# Patient Record
Sex: Female | Born: 1985 | Race: Black or African American | Hispanic: No | Smoking: Never smoker
Health system: Southern US, Community
[De-identification: ages and names within clinical notes are randomized; demographics above are authoritative.]

## PROBLEM LIST (undated history)

## (undated) DIAGNOSIS — D649 Anemia, unspecified: Secondary | ICD-10-CM

## (undated) HISTORY — PX: DILATION AND CURETTAGE OF UTERUS: SHX78

## (undated) HISTORY — DX: Anemia, unspecified: D64.9

---

## 2008-05-28 ENCOUNTER — Ambulatory Visit: Payer: Self-pay | Admitting: Obstetrics and Gynecology

## 2009-01-13 ENCOUNTER — Ambulatory Visit: Payer: Self-pay | Admitting: Internal Medicine

## 2009-07-13 ENCOUNTER — Ambulatory Visit: Payer: Self-pay

## 2009-07-31 ENCOUNTER — Ambulatory Visit: Payer: Self-pay

## 2010-02-03 ENCOUNTER — Inpatient Hospital Stay: Payer: Self-pay

## 2010-12-22 IMAGING — CT CT STONE STUDY
1 of 2 series · 15 of 32 positions shown, 19 images · non-contrast
Comparison: none

REASON FOR EXAM: UPPER LEFT STONE   SEEN ON X RAY
COMMENTS:

PROCEDURE:     CT  - CT ABDOMEN /PELVIS WO (STONE)  - January 13, 2009  [DATE]
RESULT:
TECHNIQUE: Helical 3 mm sections were obtained from the lung bases through
the pubic symphysis without the administration of intravenous contrast.

[Series 2: stone · axial · 0.82mm/px · z∈[-252,+186]mm · 15 of 160 slices shown, 19 images]
[im 7/160  soft-tissue]
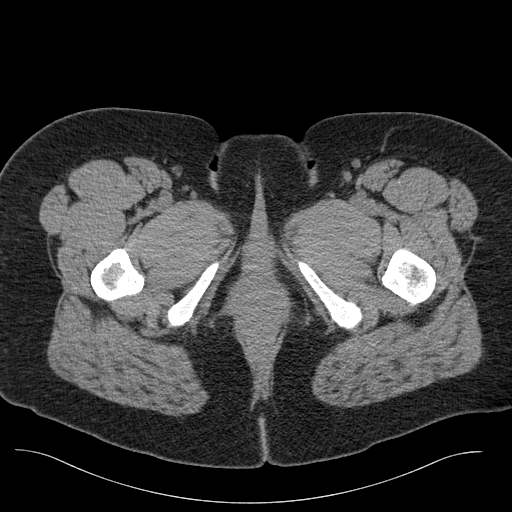
[im 7/160  bone]
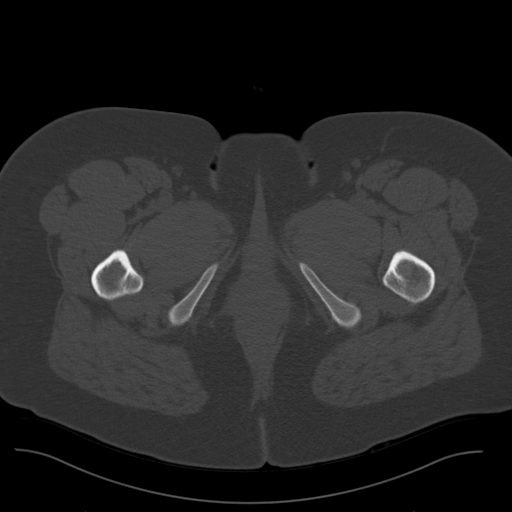
[im 19/160  soft-tissue]
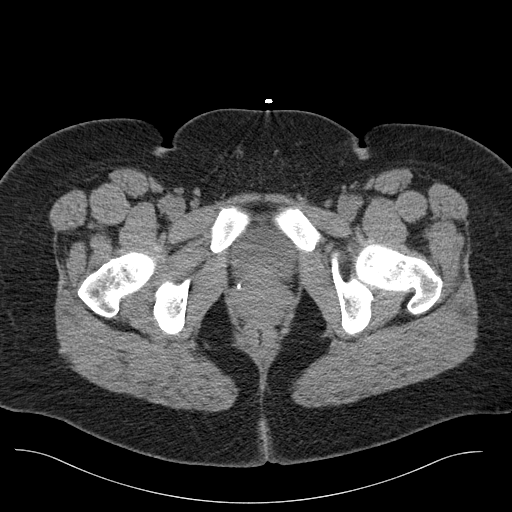
[im 31/160  soft-tissue]
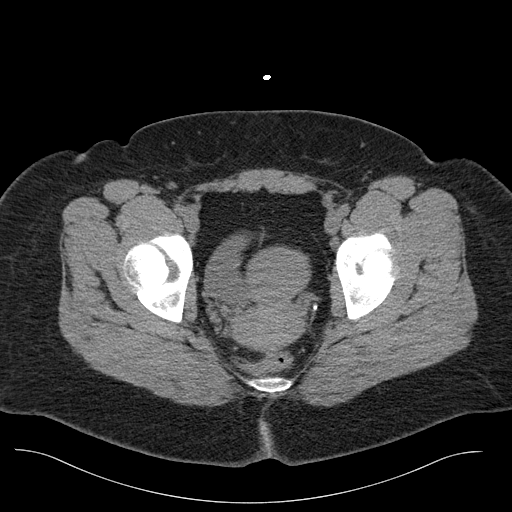
[im 43/160  soft-tissue]
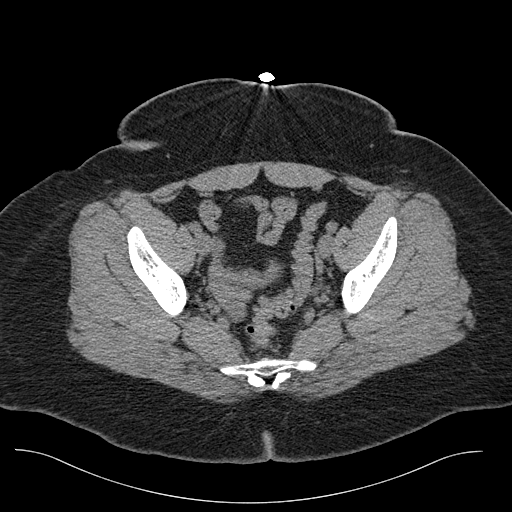
[im 56/160  soft-tissue]
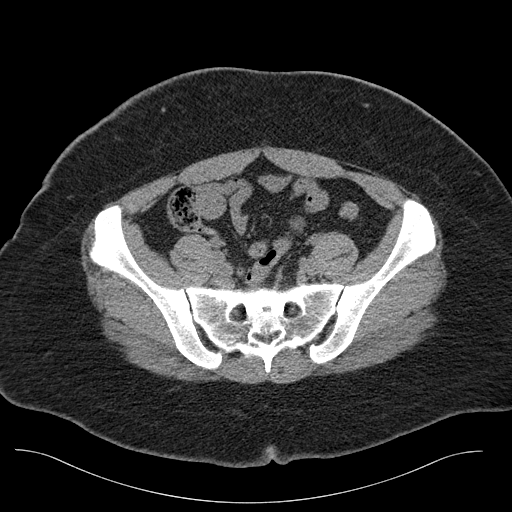
[im 68/160  soft-tissue]
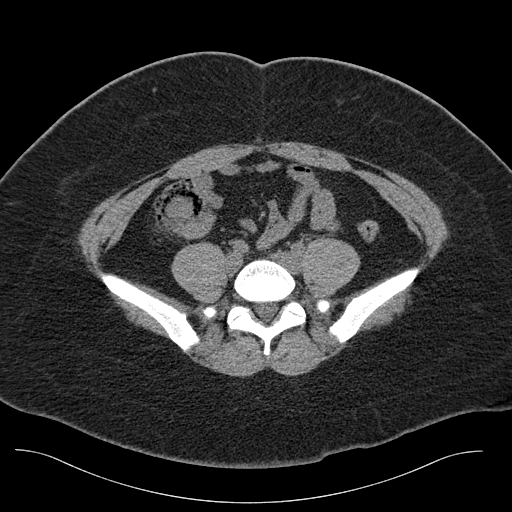
[im 80/160  soft-tissue]
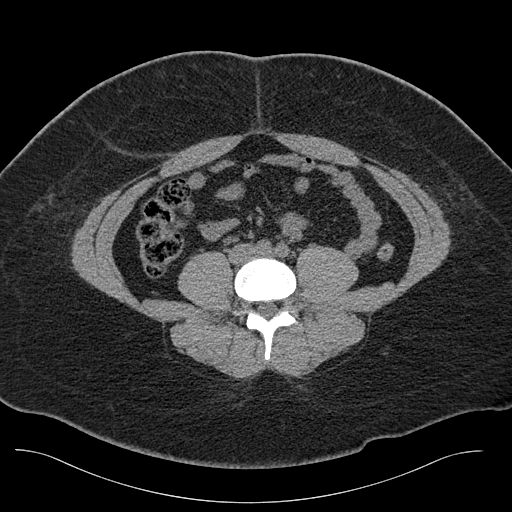
[im 92/160  soft-tissue]
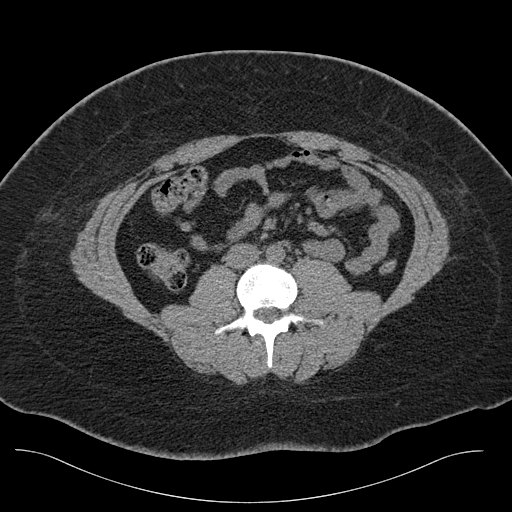
[im 104/160  soft-tissue]
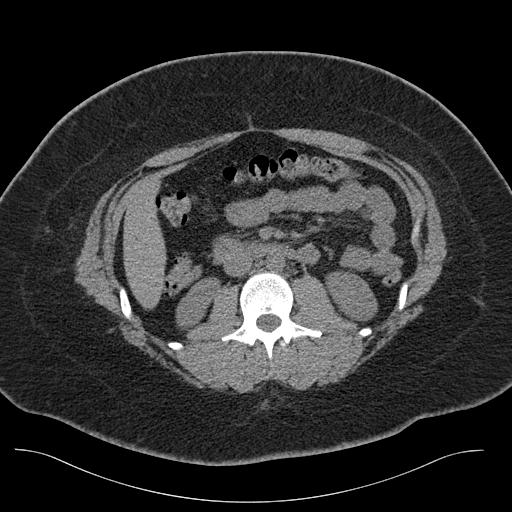
[im 104/160  bone]
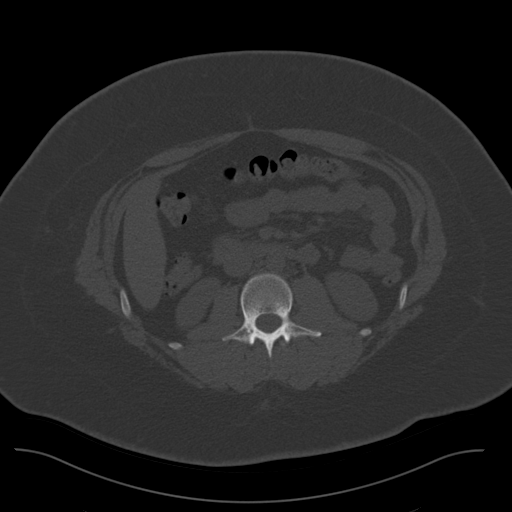
[im 117/160  soft-tissue]
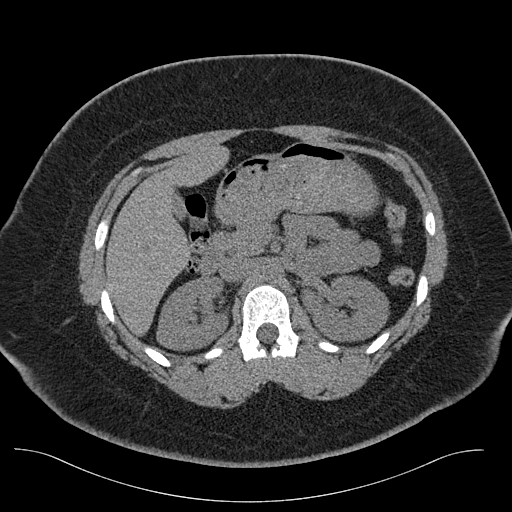
[im 129/160  soft-tissue]
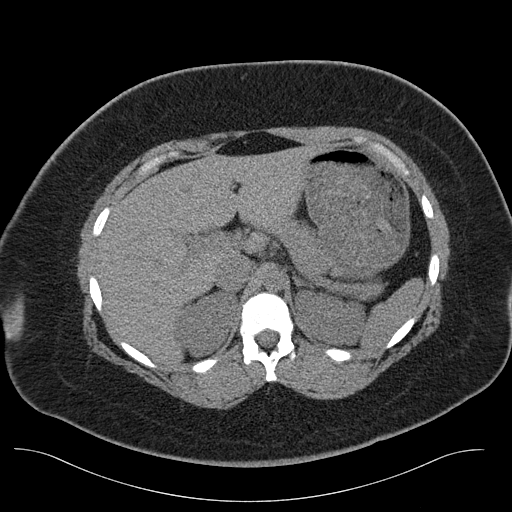
[im 135/160  lung]
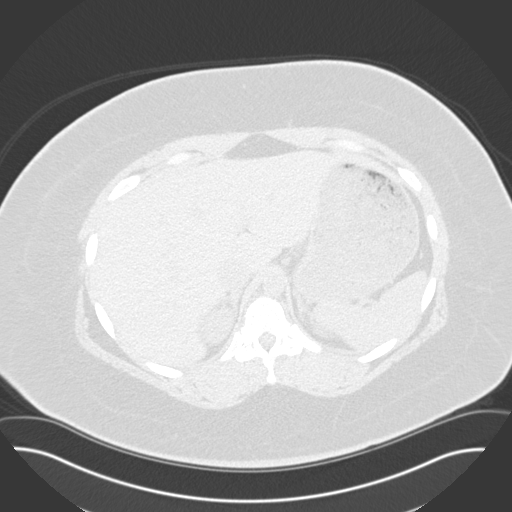
[im 141/160  soft-tissue]
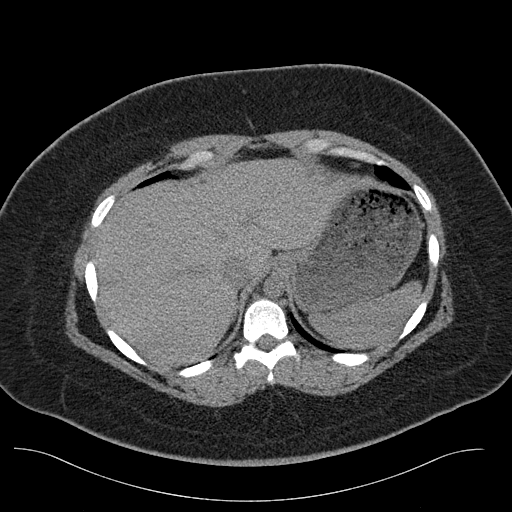
[im 141/160  lung]
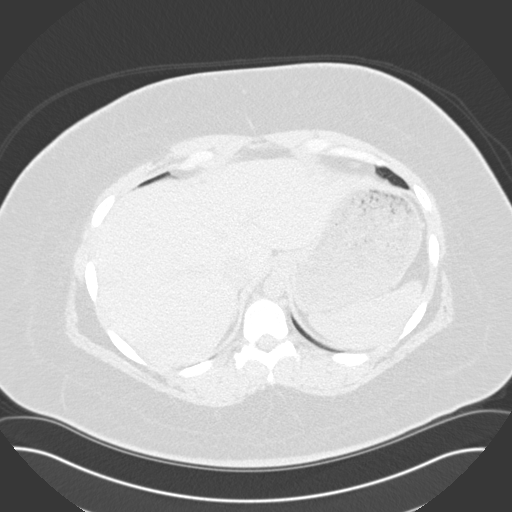
[im 147/160  lung]
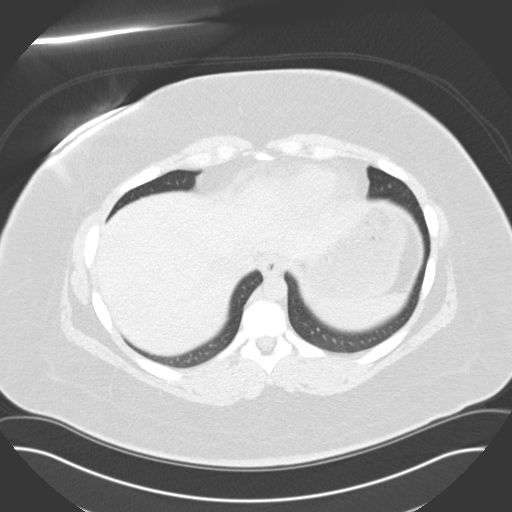
[im 153/160  soft-tissue]
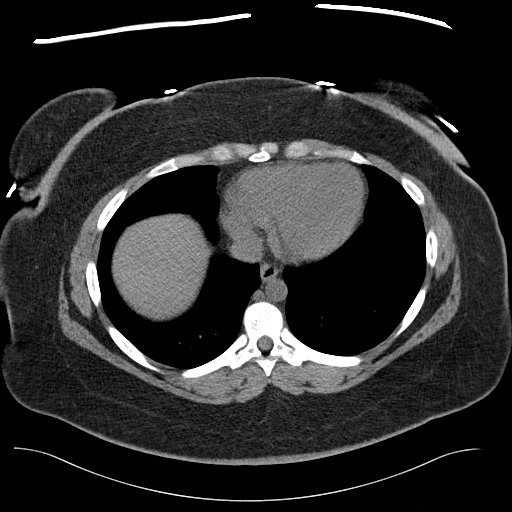
[im 153/160  lung]
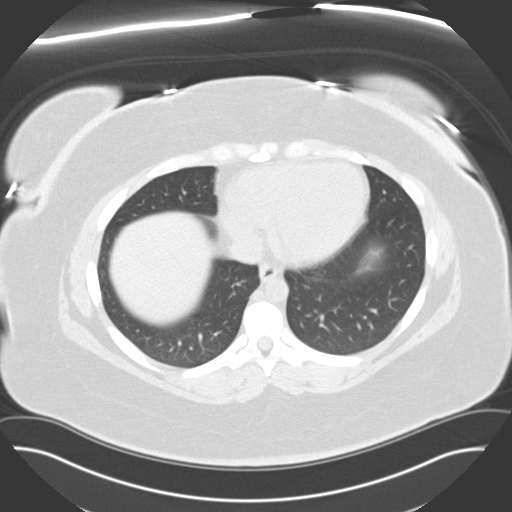

[15 of 32 positions shown; findings below may reference images not displayed]

FINDINGS: Evaluation of the lung bases demonstrates no gross abnormalities.

Noncontrast evaluation of the liver, spleen, adrenals and pancreas is
unremarkable. Evaluation of the kidneys demonstrates no evidence of
hydronephrosis nor hydroureter. There is no evidence of nephrolithiasis.
Within the pelvis multiple phleboliths project in the region of the course
of the distal left ureter. A small calculus within the distal left ureter
cannot be completely excluded though there is no CT evidence of obstructive
uropathy. This study is also degraded by patient's body habitus. Evaluation
of the right ureter demonstrates no evidence of hydroureter nor
ureterolithiasis. Within the limitations of a noncontrasted CT there is no
evidence of bowel obstruction nor secondary signs reflecting enteritis,
colitis diverticulitis nor appendicitis.
IMPRESSION: 1. No CT evidence of obstructive uropathy. A small distal left ureteral
calculus cannot be completely excluded. Visual confirmation is degraded by
multiple phleboliths in the region of the distal ureter.
2. No further evidence of obstructive or inflammatory abnormalities within
the abdomen or pelvis within the limitations of a noncontrasted CT.

These findings were discussed with Dr. Selecto of the Internal Medicine Service
at the time of the initial interpretation.

## 2012-03-08 ENCOUNTER — Inpatient Hospital Stay: Payer: Self-pay | Admitting: Obstetrics and Gynecology

## 2012-03-09 LAB — CBC WITH DIFFERENTIAL/PLATELET
Basophil %: 0.4 %
Eosinophil %: 1.8 %
HCT: 30 % — ABNORMAL LOW (ref 35.0–47.0)
Lymphocyte #: 2 10*3/uL (ref 1.0–3.6)
MCH: 26.4 pg (ref 26.0–34.0)
Monocyte #: 0.6 x10 3/mm (ref 0.2–0.9)
Platelet: 223 10*3/uL (ref 150–440)
RBC: 3.65 10*6/uL — ABNORMAL LOW (ref 3.80–5.20)
RDW: 15.3 % — ABNORMAL HIGH (ref 11.5–14.5)
WBC: 7.6 10*3/uL (ref 3.6–11.0)

## 2012-03-11 LAB — HEMATOCRIT: HCT: 22.9 % — ABNORMAL LOW (ref 35.0–47.0)

## 2014-04-06 ENCOUNTER — Observation Stay: Payer: Self-pay | Admitting: Obstetrics and Gynecology

## 2014-04-09 ENCOUNTER — Inpatient Hospital Stay: Payer: Self-pay | Admitting: Obstetrics and Gynecology

## 2014-05-26 LAB — SURGICAL PATHOLOGY

## 2014-06-10 NOTE — H&P (Signed)
L&D Evaluation:  History:  HPI 29yo [id number] at 40.3 by 12w US with EDC of 04/06/14 presents to L&D after being sent from the office for IOL due to a non-reactive NST and past due date.  She was non-reactive after 40 mins tracing and vibroacoustic stimulation.  AFI was normal.  Pregnancy issues: 1. morbid obesity, BMI 48 2. history of molar pregnancy - needs placenta sent to pathology and post partum BHCG 3. 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 history of 4200g infant  5. last growth scan EFW 43% with >96% AC 6. non-reactive NST at 40+weeks.  A+/RI/VI/ RPRNR/ hiv neg / hep b neg / gbs neg flu, tdap given antepartum plans on vasectomy for contraception   Presents with non-reactive NST, past due date   Patient's Medical History morbid obesity, history of molar pregnancy   Patient's Surgical History none   Medications Pre Natal Vitamins  Iron   Allergies NKDA   Social History none   Family History sickle cell trait   ROS:  ROS All systems were reviewed.  HEENT, CNS, GI, GU, Respiratory, CV, Renal and Musculoskeletal systems were found to be normal.   Exam:  Vital Signs stable   General no apparent distress   Mental Status clear   Chest clear   Heart normal sinus rhythm   Abdomen gravid, non-tender   Estimated Fetal Weight Average for gestational age   Fetal Position cephalic by ultrasound performed in-office   Back no CVAT   Edema no edema   Reflexes 2+   Clonus negative   Pelvic 3-4/60/-2, soft, midposition   Mebranes Intact   FHT normal rate with no decels, difficult to trace   Ucx irregular, difficult to trace   Skin dry   Lymph no lymphadenopathy   Plan:  Comments 29yo [id number] @ 40.3 with IOL for past due date and non-reactive NST  1. admit to L&D for induction 2. pitocin IOL due to bishop score >6 3. IVF 4. light diet 5. continuous toco/efm   Electronic Signatures: Amany Rando, Elenora Fenderhelsea C (MD)  (Signed 09-Mar-16 19:34)  Authored: L&D  Evaluation   Last Updated: 09-Mar-16 19:34 by Annmarie Plemmons, Elenora Fenderhelsea C (MD)

## 2016-04-14 ENCOUNTER — Encounter: Payer: Self-pay | Admitting: Obstetrics and Gynecology

## 2016-04-14 ENCOUNTER — Ambulatory Visit (INDEPENDENT_AMBULATORY_CARE_PROVIDER_SITE_OTHER): Payer: 59 | Admitting: Obstetrics and Gynecology

## 2016-04-14 VITALS — BP 112/68 | HR 71 | Ht 69.0 in | Wt 232.0 lb

## 2016-04-14 DIAGNOSIS — Z6834 Body mass index (BMI) 34.0-34.9, adult: Secondary | ICD-10-CM | POA: Diagnosis not present

## 2016-04-14 DIAGNOSIS — E669 Obesity, unspecified: Secondary | ICD-10-CM

## 2016-04-14 MED ORDER — PHENTERMINE HCL 37.5 MG PO TABS: 37.5000 mg | ORAL_TABLET | Freq: Every day | ORAL | 0 refills | Status: DC

## 2016-04-14 NOTE — Patient Instructions (Signed)

## 2016-04-14 NOTE — Progress Notes (Signed)
Gynecology Office Visit  Chief Complaint:  Chief Complaint  Patient presents with  . Weight loss check    History of Present Illness: Patientis a 31 y.o. [id number] female, who presents for the evaluation of the desire to lose weight. She has lost 7 pounds. The patient states the following symptoms since starting her weight loss therapy: appetite suppression, energy, and weight loss.  The patient also reports no other ill effects. The patient specifically denies heart palpitations, anxiety, and insomnia.   Review of Systems: 10 point review of systems negative unless otherwise noted in HPI  Past Medical History:  History reviewed. No pertinent past medical history.  Past Surgical History:  History reviewed. No pertinent surgical history.  Gynecologic History: Patient's last menstrual period was 04/05/2016.  Obstetric History: [id number]  Family History:  Family History  Problem Relation Age of Onset  . Prostate cancer Father   . Breast cancer Paternal Grandmother 87    Social History:  Social History   Social History  . Marital status: Married    Spouse name: N/A  . Number of children: N/A  . Years of education: N/A   Occupational History  . Not on file.   Social History Main Topics  . Smoking status: Never Smoker  . Smokeless tobacco: Never Used  . Alcohol use Yes     Comment: Occasional  . Drug use: No  . Sexual activity: Yes    Partners: Male   Other Topics Concern  . Not on file   Social History Narrative  . No narrative on file    Allergies:  Allergies no known allergies  Medications: Prior to Admission medications   Medication Sig Start Date End Date Taking? Authorizing Provider  phentermine (ADIPEX-P) 37.5 MG tablet Take 37.5 mg by mouth daily before breakfast.   Yes Historical Provider, MD    Physical Exam Vitals:  Vitals:   04/14/16 0856  BP: 112/68  Pulse: 71  Body mass index is 34.26 kg/m. Filed Weights   04/14/16 0856  Weight:  232 lb (105.2 kg)    Patient's last menstrual period was 04/05/2016.  General: NAD HEENT: normocephalic, anicteric Thyroid: no enlargement Pulmonary: no increased work of breathing Neurologic: Grossly intact Psychiatric: mood appropriate, affect full  Assessment: 31 y.o. [id number] follow up for obesity and medical weight loss management  Plan: Problem List Items Addressed This Visit    None    Visit Diagnoses    BMI 34.0-34.9,adult    -  Primary   Relevant Medications   phentermine (ADIPEX-P) 37.5 MG tablet   Obesity (BMI 30.0-34.9)       Relevant Medications   phentermine (ADIPEX-P) 37.5 MG tablet      1) 1800 Calorie ADA Diet  2) Patient education given regarding appropriate lifestyle changes for weight loss including: regular physical activity, healthy coping strategies, caloric restriction and healthy eating patterns.  3) Patient will be started on weight loss medication. The risks and benefits and side effects of medication, such as Adipex (Phenteramine) ,  Tenuate (Diethylproprion), Belviq (lorcarsin), Contrave (buproprion/naltrexone), Qsymia (phentermine/topiramate), and Saxenda (liraglutide) is discussed. The pros and cons of suppressing appetite and boosting metabolism is discussed. Risks of tolerence and addiction is discussed for selected agents discussed. Use of medicine will ne short term, such as 3-4 months at a time followed by a period of time off of the medicine to avoid these risks and side effects for Adipex, Qsymia, and Tenuate discussed. Pt to call with any negative  side effects and agrees to keep follow up appts.  4) Patient to take medication, with the benefits of appetite suppression and metabolism boost d/w pt, along with the side effects and risk factors of long term use that will be avoided with our use of short bursts of therapy. Rx provided.    5) 15 minutes face-to-face; with counseling/coordination of care > 50 percent of visit related to obesity and  ongoing management/treatment   6) Follow up in 4 weeks to assess response

## 2016-05-12 ENCOUNTER — Ambulatory Visit (INDEPENDENT_AMBULATORY_CARE_PROVIDER_SITE_OTHER): Payer: 59 | Admitting: Obstetrics and Gynecology

## 2016-05-12 ENCOUNTER — Encounter: Payer: Self-pay | Admitting: Obstetrics and Gynecology

## 2016-05-12 VITALS — BP 100/58 | HR 77 | Ht 69.0 in | Wt 224.0 lb

## 2016-05-12 DIAGNOSIS — Z6833 Body mass index (BMI) 33.0-33.9, adult: Secondary | ICD-10-CM | POA: Diagnosis not present

## 2016-05-12 DIAGNOSIS — E669 Obesity, unspecified: Secondary | ICD-10-CM | POA: Diagnosis not present

## 2016-05-12 MED ORDER — PHENTERMINE HCL 37.5 MG PO TABS: 37.5000 mg | ORAL_TABLET | Freq: Every day | ORAL | 0 refills | Status: DC

## 2016-05-12 NOTE — Progress Notes (Signed)
Gynecology Office Visit  Chief Complaint:  Chief Complaint  Patient presents with  . Weight Check    History of Present Illness: Patientis a 31 y.o. [id number] female, who presents for the evaluation of the desire to lose weight. She has lost 8 pounds. The patient states the following symptoms since starting her weight loss therapy: appetite suppression, energy, and weight loss.  The patient also reports no other ill effects. The patient specifically denies heart palpitations, anxiety, and insomnia.    Review of Systems: 10 point review of systems negative unless otherwise noted in HPI  Past Medical History:  History reviewed. No pertinent past medical history.  Past Surgical History:  History reviewed. No pertinent surgical history.  Gynecologic History: Patient's last menstrual period was 05/02/2016.  Obstetric History: [id number]  Family History:  Family History  Problem Relation Age of Onset  . Prostate cancer Father   . Breast cancer Paternal Grandmother 38    Social History:  Social History   Social History  . Marital status: Married    Spouse name: N/A  . Number of children: N/A  . Years of education: N/A   Occupational History  . Not on file.   Social History Main Topics  . Smoking status: Never Smoker  . Smokeless tobacco: Never Used  . Alcohol use Yes     Comment: Occasional  . Drug use: No  . Sexual activity: Yes    Partners: Male   Other Topics Concern  . Not on file   Social History Narrative  . No narrative on file    Allergies:  No Known Allergies  Medications: Prior to Admission medications   Medication Sig Start Date End Date Taking? Authorizing Provider  phentermine (ADIPEX-P) 37.5 MG tablet Take 1 tablet (37.5 mg total) by mouth daily before breakfast. 04/14/16   [name], MD    Physical Exam Vitals:  Vitals:   05/12/16 1600  BP: (!) 100/58  Pulse: 77   Patient's last menstrual period was 05/02/2016. Filed Weights     05/12/16 1600  Weight: 224 lb (101.6 kg)   Body mass index is 33.08 kg/m.  General: NAD HEENT: normocephalic, anicteric Thyroid: no enlargement Pulmonary: no increased work of breathing Neurologic: Grossly intact Psychiatric: mood appropriate, affect full  Assessment: 31 y.o. weight loss follow up  Plan: Problem List Items Addressed This Visit    None    Visit Diagnoses    Class 1 obesity with serious comorbidity and body mass index (BMI) of 33.0 to 33.9 in adult, unspecified obesity type    -  Primary   Relevant Medications   phentermine (ADIPEX-P) 37.5 MG tablet   BMI 33.0-33.9,adult       BMI 34.0-34.9,adult       Relevant Medications   phentermine (ADIPEX-P) 37.5 MG tablet   Obesity (BMI 30.0-34.9)       Relevant Medications   phentermine (ADIPEX-P) 37.5 MG tablet      1) 1500 Calorie ADA Diet  2) Patient education given regarding appropriate lifestyle changes for weight loss including: regular physical activity, healthy coping strategies, caloric restriction and healthy eating patterns.  3) Patient will be started on weight loss medication. The risks and benefits and side effects of medication, such as Adipex (Phenteramine) ,  Tenuate (Diethylproprion), Belviq (lorcarsin), Contrave (buproprion/naltrexone), Qsymia (phentermine/topiramate), and Saxenda (liraglutide) is discussed. The pros and cons of suppressing appetite and boosting metabolism is discussed. Risks of tolerence and addiction is discussed for selected agents discussed.  Use of medicine will ne short term, such as 3-4 months at a time followed by a period of time off of the medicine to avoid these risks and side effects for Adipex, Qsymia, and Tenuate discussed. Pt to call with any negative side effects and agrees to keep follow up appts.  4) Patient to take medication, with the benefits of appetite suppression and metabolism boost d/w pt, along with the side effects and risk factors of long term use that  will be avoided with our use of short bursts of therapy. Rx provided.    5) 15 minutes face-to-face; with counseling/coordination of care > 50 percent of visit related to obesity and ongoing management/treatment   6) Follow up in 4 weeks to assess response

## 2016-09-12 ENCOUNTER — Ambulatory Visit: Payer: 59 | Admitting: Obstetrics and Gynecology

## 2016-09-13 ENCOUNTER — Ambulatory Visit (INDEPENDENT_AMBULATORY_CARE_PROVIDER_SITE_OTHER): Payer: 59 | Admitting: Obstetrics and Gynecology

## 2016-09-13 ENCOUNTER — Encounter: Payer: Self-pay | Admitting: Obstetrics and Gynecology

## 2016-09-13 DIAGNOSIS — E669 Obesity, unspecified: Secondary | ICD-10-CM | POA: Diagnosis not present

## 2016-09-13 MED ORDER — PHENTERMINE HCL 37.5 MG PO TABS: 37.5000 mg | ORAL_TABLET | Freq: Every day | ORAL | 0 refills | Status: DC

## 2016-09-13 NOTE — Progress Notes (Signed)
Gynecology Office Visit  Chief Complaint:  Chief Complaint  Patient presents with  . Weight Check    History of Present Illness: Patientis a 31 y.o. [id number] female, who presents for the evaluation of the desire to lose weight. She has lost 9 pounds in the last 3 months since her last phentermine dose. She has been continuing to watch diet, and exercise.  Would like to restart phentermine.    Review of Systems: 10 point review of systems negative unless otherwise noted in HPI  Past Medical History:  Past Medical History:  Diagnosis Date  . Anemia     Past Surgical History:  Past Surgical History:  Procedure Laterality Date  . DILATION AND CURETTAGE OF UTERUS      Gynecologic History: Patient's last menstrual period was 08/20/2016 (exact date).  Obstetric History: [id number]  Family History:  Family History  Problem Relation Age of Onset  . Prostate cancer Father   . Breast cancer Paternal Grandmother [phone]    Social History:  Social History   Social History  . Marital status: Married    Spouse name: N/A  . Number of children: N/A  . Years of education: N/A   Occupational History  . Not on file.   Social History Main Topics  . Smoking status: Never Smoker  . Smokeless tobacco: Never Used  . Alcohol use Yes     Comment: Occasional  . Drug use: No  . Sexual activity: Yes    Partners: Male    Birth control/ protection: None   Other Topics Concern  . Not on file   Social History Narrative  . No narrative on file    Allergies:  No Known Allergies  Medications: Prior to Admission medications   Medication Sig Start Date End Date Taking? Authorizing Provider  phentermine (ADIPEX-P) 37.5 MG tablet Take 1 tablet (37.5 mg total) by mouth daily before breakfast. 09/13/16   Vena AustriaStaebler, Ulus Hazen, MD    Physical Exam Blood pressure 110/66, pulse 70, height 5\' 9"  (1.753 m), weight 213 lb (96.6 kg), last menstrual period 08/20/2016. Body mass index is 31.45  kg/m.   General: NAD HEENT: normocephalic, anicteric Thyroid: no enlargement Pulmonary: no increased work of breathing Neurologic: Grossly intact Psychiatric: mood appropriate, affect full  Assessment: 31 y.o. [id number] medical weight loss follow up  Plan: Problem List Items Addressed This Visit    None    Visit Diagnoses    Obesity (BMI 30.0-34.9)       Relevant Medications   phentermine (ADIPEX-P) 37.5 MG tablet      1) 1500 Calorie ADA Diet  2) Patient education given regarding appropriate lifestyle changes for weight loss including: regular physical activity, healthy coping strategies, caloric restriction and healthy eating patterns.  3) Patient will be started on weight loss medication. The risks and benefits and side effects of medication, such as Adipex (Phenteramine) ,  Tenuate (Diethylproprion), Belviq (lorcarsin), Contrave (buproprion/naltrexone), Qsymia (phentermine/topiramate), and Saxenda (liraglutide) is discussed. The pros and cons of suppressing appetite and boosting metabolism is discussed. Risks of tolerence and addiction is discussed for selected agents discussed. Use of medicine will ne short term, such as 3-4 months at a time followed by a period of time off of the medicine to avoid these risks and side effects for Adipex, Qsymia, and Tenuate discussed. Pt to call with any negative side effects and agrees to keep follow up appts.  4) Patient to take medication, with the benefits of appetite suppression and metabolism  boost d/w pt, along with the side effects and risk factors of long term use that will be avoided with our use of short bursts of therapy. Rx provided.    5) 15 minutes face-to-face; with counseling/coordination of care > 50 percent of visit related to obesity and ongoing management/treatment   6) Follow up in 4 weeks to assess response

## 2016-10-14 ENCOUNTER — Ambulatory Visit: Payer: 59 | Admitting: Obstetrics and Gynecology

## 2016-10-19 ENCOUNTER — Ambulatory Visit (INDEPENDENT_AMBULATORY_CARE_PROVIDER_SITE_OTHER): Payer: 59 | Admitting: Obstetrics and Gynecology

## 2016-10-19 ENCOUNTER — Encounter: Payer: Self-pay | Admitting: Obstetrics and Gynecology

## 2016-10-19 VITALS — BP 120/68 | HR 88 | Ht 69.0 in | Wt 207.0 lb

## 2016-10-19 DIAGNOSIS — E66811 Obesity, class 1: Secondary | ICD-10-CM

## 2016-10-19 DIAGNOSIS — Z6830 Body mass index (BMI) 30.0-30.9, adult: Secondary | ICD-10-CM | POA: Diagnosis not present

## 2016-10-19 DIAGNOSIS — E669 Obesity, unspecified: Secondary | ICD-10-CM | POA: Diagnosis not present

## 2016-10-19 MED ORDER — PHENTERMINE HCL 37.5 MG PO TABS: 37.5000 mg | ORAL_TABLET | Freq: Every day | ORAL | 0 refills | Status: DC

## 2016-10-19 NOTE — Progress Notes (Signed)
Gynecology Office Visit  Chief Complaint:  Chief Complaint  Patient presents with  . Weight Check    History of Present Illness: Patientis a 31 y.o. [id number] female, who presents for the evaluation of the desire to lose weight. She has lost 6 pounds. The patient states the following symptoms since starting her weight loss therapy: appetite suppression, energy, and weight loss.  The patient also reports no other ill effects. The patient specifically denies heart palpitations, anxiety, and insomnia.    Review of Systems: 10 point review of systems negative unless otherwise noted in HPI  Past Medical History:  Past Medical History:  Diagnosis Date  . Anemia     Past Surgical History:  Past Surgical History:  Procedure Laterality Date  . DILATION AND CURETTAGE OF UTERUS      Gynecologic History: Patient's last menstrual period was 10/10/2016.  Obstetric History: [id number]  Family History:  Family History  Problem Relation Age of Onset  . Prostate cancer Father   . Breast cancer Paternal Grandmother 23    Social History:  Social History   Social History  . Marital status: Married    Spouse name: N/A  . Number of children: N/A  . Years of education: N/A   Occupational History  . Not on file.   Social History Main Topics  . Smoking status: Never Smoker  . Smokeless tobacco: Never Used  . Alcohol use Yes     Comment: Occasional  . Drug use: No  . Sexual activity: Yes    Partners: Male    Birth control/ protection: None   Other Topics Concern  . Not on file   Social History Narrative  . No narrative on file    Allergies:  No Known Allergies  Medications: Prior to Admission medications   Medication Sig Start Date End Date Taking? Authorizing Provider  phentermine (ADIPEX-P) 37.5 MG tablet Take 1 tablet (37.5 mg total) by mouth daily before breakfast. 09/13/16  Yes [name], MD    Physical Exam Blood pressure 120/68, pulse 88, height   (1.753 m), weight 207 lb (93.9 kg), last menstrual period 10/10/2016. Body mass index is 30.57 kg/m.  General: NAD HEENT: normocephalic, anicteric Thyroid: no enlargement Pulmonary: no increased work of breathing Neurologic: Grossly intact Psychiatric: mood appropriate, affect full  Assessment: 31 y.o. [id number] medical weight loss follow up  Plan: Problem List Items Addressed This Visit    None    Visit Diagnoses    Class 1 obesity with serious comorbidity and body mass index (BMI) of 30.0 to 30.9 in adult, unspecified obesity type    -  Primary   Relevant Medications   phentermine (ADIPEX-P) 37.5 MG tablet   Obesity (BMI 30.0-34.9)       Relevant Medications   phentermine (ADIPEX-P) 37.5 MG tablet      1) 1500 Calorie ADA Diet  2) Patient education given regarding appropriate lifestyle changes for weight loss including: regular physical activity, healthy coping strategies, caloric restriction and healthy eating patterns.  3) Patient will be started on weight loss medication. The risks and benefits and side effects of medication, such as Adipex (Phenteramine) ,  Tenuate (Diethylproprion), Belviq (lorcarsin), Contrave (buproprion/naltrexone), Qsymia (phentermine/topiramate), and Saxenda (liraglutide) is discussed. The pros and cons of suppressing appetite and boosting metabolism is discussed. Risks of tolerence and addiction is discussed for selected agents discussed. Use of medicine will ne short term, such as 3-4 months at a time followed by a period of  time off of the medicine to avoid these risks and side effects for Adipex, Qsymia, and Tenuate discussed. Pt to call with any negative side effects and agrees to keep follow up appts.  4) Patient to take medication, with the benefits of appetite suppression and metabolism boost d/w pt, along with the side effects and risk factors of long term use that will be avoided with our use of short bursts of therapy. Rx provided.    5) 15  minutes face-to-face; with counseling/coordination of care > 50 percent of visit related to obesity and ongoing management/treatment   6) Follow up in 4 weeks to assess response

## 2016-11-16 ENCOUNTER — Ambulatory Visit (INDEPENDENT_AMBULATORY_CARE_PROVIDER_SITE_OTHER): Payer: 59 | Admitting: Obstetrics and Gynecology

## 2016-11-16 ENCOUNTER — Encounter: Payer: Self-pay | Admitting: Obstetrics and Gynecology

## 2016-11-16 VITALS — BP 110/68 | HR 91 | Ht 69.0 in | Wt 209.0 lb

## 2016-11-16 DIAGNOSIS — Z6830 Body mass index (BMI) 30.0-30.9, adult: Secondary | ICD-10-CM

## 2016-11-16 DIAGNOSIS — E669 Obesity, unspecified: Secondary | ICD-10-CM | POA: Diagnosis not present

## 2016-11-16 MED ORDER — DIETHYLPROPION HCL ER 75 MG PO TB24: 1.0000 | ORAL_TABLET | Freq: Every day | ORAL | 0 refills | Status: DC

## 2016-11-16 NOTE — Progress Notes (Signed)
Gynecology Office Visit  Chief Complaint:  Chief Complaint  Patient presents with  . Weight Check    History of Present Illness: Patientis a 31 y.o. [id number] female, who presents for the evaluation of the desire to lose weight. She has gained 2 pounds. The patient states the following symptoms since starting her weight loss therapy: appetite suppression, energy, and weight loss.  The patient also reports no other ill effects. The patient specifically denies heart palpitations, anxiety, and insomnia.    Review of Systems: 10 point review of systems negative unless otherwise noted in HPI  Past Medical History:  Past Medical History:  Diagnosis Date  . Anemia     Past Surgical History:  Past Surgical History:  Procedure Laterality Date  . DILATION AND CURETTAGE OF UTERUS      Gynecologic History: Patient's last menstrual period was 10/31/2016.  Obstetric History: [id number]  Family History:  Family History  Problem Relation Age of Onset  . Prostate cancer Father   . Breast cancer Paternal Grandmother [id number]    Social History:  Social History   Social History  . Marital status: Married    Spouse name: N/A  . Number of children: N/A  . Years of education: N/A   Occupational History  . Not on file.   Social History Main Topics  . Smoking status: Never Smoker  . Smokeless tobacco: Never Used  . Alcohol use Yes     Comment: Occasional  . Drug use: No  . Sexual activity: Yes    Partners: Male    Birth control/ protection: None   Other Topics Concern  . Not on file   Social History Narrative  . No narrative on file    Allergies:  No Known Allergies  Medications: Prior to Admission medications   Medication Sig Start Date End Date Taking? Authorizing Provider  phentermine (ADIPEX-P) 37.5 MG tablet Take 1 tablet (37.5 mg total) by mouth daily before breakfast. 10/19/16  Yes Vena AustriaStaebler, Jizelle Conkey, MD    Physical Exam Blood pressure 110/68, pulse 91, height 5'  9" (1.753 m), weight 209 lb (94.8 kg), last menstrual period 10/31/2016. Body mass index is 30.86 kg/m.  General: NAD HEENT: normocephalic, anicteric Thyroid: no enlargement Pulmonary: no increased work of breathing Neurologic: Grossly intact Psychiatric: mood appropriate, affect full  Assessment: 31 y.o. [id number] medical weight loss follow up  Plan: Problem List Items Addressed This Visit    None    Visit Diagnoses    Class 1 obesity without serious comorbidity with body mass index (BMI) of 30.0 to 30.9 in adult, unspecified obesity type    -  Primary   Relevant Medications   Diethylpropion HCl CR 75 MG TB24      1) 1500 Calorie ADA Diet  2) Patient education given regarding appropriate lifestyle changes for weight loss including: regular physical activity, healthy coping strategies, caloric restriction and healthy eating patterns.  3) Patient will be started on weight loss medication. The risks and benefits and side effects of medication, such as Adipex (Phenteramine) ,  Tenuate (Diethylproprion), Belviq (lorcarsin), Contrave (buproprion/naltrexone), Qsymia (phentermine/topiramate), and Saxenda (liraglutide) is discussed. The pros and cons of suppressing appetite and boosting metabolism is discussed. Risks of tolerence and addiction is discussed for selected agents discussed. Use of medicine will ne short term, such as 3-4 months at a time followed by a period of time off of the medicine to avoid these risks and side effects for Adipex, Qsymia, and Tenuate discussed. Pt  to call with any negative side effects and agrees to keep follow up appts.  4) Patient to take medication, with the benefits of appetite suppression and metabolism boost d/w pt, along with the side effects and risk factors of long term use that will be avoided with our use of short bursts of therapy. Rx provided. Switch to Tenuate   5) 15 minutes face-to-face; with counseling/coordination of care > 50 percent of visit  related to obesity and ongoing management/treatment   6) Follow up in 4 weeks to assess response

## 2016-12-14 ENCOUNTER — Encounter: Payer: Self-pay | Admitting: Obstetrics and Gynecology

## 2016-12-14 ENCOUNTER — Ambulatory Visit (INDEPENDENT_AMBULATORY_CARE_PROVIDER_SITE_OTHER): Payer: 59 | Admitting: Obstetrics and Gynecology

## 2016-12-14 VITALS — BP 112/78 | HR 80 | Ht 69.0 in | Wt 209.0 lb

## 2016-12-14 DIAGNOSIS — Z6830 Body mass index (BMI) 30.0-30.9, adult: Secondary | ICD-10-CM

## 2016-12-14 DIAGNOSIS — E669 Obesity, unspecified: Secondary | ICD-10-CM | POA: Diagnosis not present

## 2016-12-14 MED ORDER — DIETHYLPROPION HCL ER 75 MG PO TB24: 1.0000 | ORAL_TABLET | Freq: Every day | ORAL | 0 refills | Status: DC

## 2016-12-14 NOTE — Progress Notes (Signed)
Gynecology Office Visit  Chief Complaint:  Chief Complaint  Patient presents with  . Weight Check    History of Present Illness: Patientis a 31 y.o. [id number] female, who presents for the evaluation of the desire to lose weight. She has lost 0 pounds (4lbs on home scale). The patient states the following symptoms since starting her weight loss therapy: appetite suppression, energy, and weight loss.  The patient also reports no other ill effects. The patient specifically denies heart palpitations, anxiety, and insomnia.    Review of Systems: 10 point review of systems negative unless otherwise noted in HPI  Past Medical History:  Past Medical History:  Diagnosis Date  . Anemia     Past Surgical History:  Past Surgical History:  Procedure Laterality Date  . DILATION AND CURETTAGE OF UTERUS      Gynecologic History: Patient's last menstrual period was 11/28/2016.  Obstetric History: [id number]  Family History:  Family History  Problem Relation Age of Onset  . Prostate cancer Father   . Breast cancer Paternal Grandmother [id number]    Social History:  Social History   Socioeconomic History  . Marital status: Married    Spouse name: Not on file  . Number of children: Not on file  . Years of education: Not on file  . Highest education level: Not on file  Social Needs  . Financial resource strain: Not on file  . Food insecurity - worry: Not on file  . Food insecurity - inability: Not on file  . Transportation needs - medical: Not on file  . Transportation needs - non-medical: Not on file  Occupational History  . Not on file  Tobacco Use  . Smoking status: Never Smoker  . Smokeless tobacco: Never Used  Substance and Sexual Activity  . Alcohol use: Yes    Comment: Occasional  . Drug use: No  . Sexual activity: Yes    Partners: Male    Birth control/protection: None  Other Topics Concern  . Not on file  Social History Narrative  . Not on file    Allergies:  No  Known Allergies  Medications: Prior to Admission medications   Medication Sig Start Date End Date Taking? Authorizing Provider  Diethylpropion HCl CR 75 MG TB24 Take 1 tablet (75 mg total) by mouth daily before breakfast. 11/16/16   Vena AustriaStaebler, Gabrielle Wakeland, MD    Physical Exam Blood pressure 112/78, pulse 80, height 5\' 9"  (1.753 m), weight 209 lb (94.8 kg), last menstrual period 11/28/2016. Body mass index is 30.86 kg/m.  General: NAD HEENT: normocephalic, anicteric Thyroid: no enlargement Pulmonary: no increased work of breathing Neurologic: Grossly intact Psychiatric: mood appropriate, affect full  Assessment: 31 y.o. [id number] No problem-specific Assessment & Plan notes found for this encounter.   Plan: Problem List Items Addressed This Visit    None    Visit Diagnoses    Class 1 obesity without serious comorbidity with body mass index (BMI) of 30.0 to 30.9 in adult, unspecified obesity type    -  Primary   Relevant Medications   Diethylpropion HCl CR 75 MG TB24      1) 1500 Calorie ADA Diet  2) Patient education given regarding appropriate lifestyle changes for weight loss including: regular physical activity, healthy coping strategies, caloric restriction and healthy eating patterns.  3) Patient will be started on weight loss medication. The risks and benefits and side effects of medication, such as Adipex (Phenteramine) ,  Tenuate (Diethylproprion), Belviq (lorcarsin), Contrave (buproprion/naltrexone),  Qsymia (phentermine/topiramate), and Saxenda (liraglutide) is discussed. The pros and cons of suppressing appetite and boosting metabolism is discussed. Risks of tolerence and addiction is discussed for selected agents discussed. Use of medicine will ne short term, such as 3-4 months at a time followed by a period of time off of the medicine to avoid these risks and side effects for Adipex, Qsymia, and Tenuate discussed. Pt to call with any negative side effects and agrees to keep  follow up appts.  4) Patient to take medication, with the benefits of appetite suppression and metabolism boost d/w pt, along with the side effects and risk factors of long term use that will be avoided with our use of short bursts of therapy. Rx provided.    5) 15 minutes face-to-face; with counseling/coordination of care > 50 percent of visit related to obesity and ongoing management/treatment   6) Follow up in 4 weeks to assess response

## 2017-01-10 ENCOUNTER — Ambulatory Visit: Payer: 59 | Admitting: Obstetrics and Gynecology

## 2017-05-01 ENCOUNTER — Encounter: Payer: Self-pay | Admitting: Obstetrics and Gynecology

## 2017-05-01 ENCOUNTER — Ambulatory Visit: Payer: Managed Care, Other (non HMO) | Admitting: Obstetrics and Gynecology

## 2017-05-01 VITALS — BP 110/68 | HR 70 | Ht 69.0 in | Wt 219.0 lb

## 2017-05-01 DIAGNOSIS — Z6832 Body mass index (BMI) 32.0-32.9, adult: Secondary | ICD-10-CM | POA: Diagnosis not present

## 2017-05-01 DIAGNOSIS — E669 Obesity, unspecified: Secondary | ICD-10-CM | POA: Diagnosis not present

## 2017-05-01 MED ORDER — NALTREXONE-BUPROPION HCL ER 8-90 MG PO TB12: 2.0000 | ORAL_TABLET | Freq: Two times a day (BID) | ORAL | 1 refills | Status: DC

## 2017-05-01 MED ORDER — NALTREXONE-BUPROPION HCL ER 8-90 MG PO TB12: ORAL_TABLET | ORAL | 0 refills | Status: DC

## 2017-05-01 NOTE — Progress Notes (Signed)
Gynecology Office Visit  Chief Complaint:  Chief Complaint  Patient presents with  . Follow-up    Weight loss    History of Present Illness: Patientis a 32 y.o. [id number] female, who presents for the evaluation of the desire to lose weight. Gained 10lbs since her last weight loss evaluation.  She is interested in restarting therapy.  She has previously trialed phentermine with good results but appeared to be treatment resistant during her last course.  She was switched to diethylpropion and did not note any improved therapeutic efficacy.  The patient is currently following a keto diet.    Review of Systems: 10 point review of systems negative unless otherwise noted in HPI  Past Medical History:  Past Medical History:  Diagnosis Date  . Anemia     Past Surgical History:  Past Surgical History:  Procedure Laterality Date  . DILATION AND CURETTAGE OF UTERUS      Gynecologic History: Patient's last menstrual period was 04/30/2017 (exact date).  Obstetric History: [id number]  Family History:  Family History  Problem Relation Age of Onset  . Prostate cancer Father   . Breast cancer Paternal Grandmother [id number]    Social History:  Social History   Socioeconomic History  . Marital status: Married    Spouse name: Not on file  . Number of children: Not on file  . Years of education: Not on file  . Highest education level: Not on file  Occupational History  . Not on file  Social Needs  . Financial resource strain: Not on file  . Food insecurity:    Worry: Not on file    Inability: Not on file  . Transportation needs:    Medical: Not on file    Non-medical: Not on file  Tobacco Use  . Smoking status: Never Smoker  . Smokeless tobacco: Never Used  Substance and Sexual Activity  . Alcohol use: Yes    Comment: Occasional  . Drug use: No  . Sexual activity: Yes    Partners: Male    Birth control/protection: None  Lifestyle  . Physical activity:    Days per week: Not on  file    Minutes per session: Not on file  . Stress: Not on file  Relationships  . Social connections:    Talks on phone: Not on file    Gets together: Not on file    Attends religious service: Not on file    Active member of club or organization: Not on file    Attends meetings of clubs or organizations: Not on file    Relationship status: Not on file  . Intimate partner violence:    Fear of current or ex partner: Not on file    Emotionally abused: Not on file    Physically abused: Not on file    Forced sexual activity: Not on file  Other Topics Concern  . Not on file  Social History Narrative  . Not on file    Allergies:  No Known Allergies  Medications: Prior to Admission medications   Medication Sig Start Date End Date Taking? Authorizing Provider  Diethylpropion HCl CR 75 MG TB24 Take 1 tablet (75 mg total) daily before breakfast by mouth. Patient not taking: Reported on 05/01/2017 12/14/16   Vena AustriaStaebler, Cabria Micalizzi, MD    Physical Exam Blood pressure 110/68, pulse 70, height 5\' 9"  (1.753 m), weight 219 lb (99.3 kg), last menstrual period 04/30/2017. Body mass index is 32.34 kg/m.  Wt Readings  from Last 3 Encounters:  05/01/17 219 lb (99.3 kg)  12/14/16 209 lb (94.8 kg)  11/16/16 209 lb (94.8 kg)    General: NAD HEENT: normocephalic, anicteric Thyroid: no enlargement Pulmonary: no increased work of breathing Neurologic: Grossly intact Psychiatric: mood appropriate, affect full  Assessment: 32 y.o. [id number] medical weight loss  Plan: Problem List Items Addressed This Visit    None    Visit Diagnoses    Class 1 obesity without serious comorbidity with body mass index (BMI) of 32.0 to 32.9 in adult, unspecified obesity type    -  Primary   Relevant Medications   Naltrexone-buPROPion HCl ER (CONTRAVE) 8-90 MG TB12   Naltrexone-buPROPion HCl ER (CONTRAVE) 8-90 MG TB12      1) 1200-1500 Calorie ADA Diet  2) Patient education given regarding appropriate lifestyle  changes for weight loss including: regular physical activity, healthy coping strategies, caloric restriction and healthy eating patterns.  3) Patient will be started on weight loss medication. The risks and benefits and side effects of medication, such as Adipex (Phenteramine) ,  Tenuate (Diethylproprion), Belviq (lorcarsin), Contrave (buproprion/naltrexone), Qsymia (phentermine/topiramate), and Saxenda (liraglutide) is discussed. The pros and cons of suppressing appetite and boosting metabolism is discussed. Risks of tolerence and addiction is discussed for selected agents discussed. Use of medicine will ne short term, such as 3-4 months at a time followed by a period of time off of the medicine to avoid these risks and side effects for Adipex, Qsymia, and Tenuate discussed. Pt to call with any negative side effects and agrees to keep follow up appts. - after discussing available treatment modalities patient opts for a trial of contrave  4) Patient to take medication, with the benefits of appetite suppression and metabolism boost d/w pt, along with the side effects and risk factors of long term use that will be avoided with our use of short bursts of therapy. Rx provided.    5) 15 minutes face-to-face; with counseling/coordination of care > 50 percent of visit related to obesity and ongoing management/treatment   6)  Return in about 3 months (around 07/31/2017) for wt check.    [name], MD, [facility], [facility] 05/01/2017, 7:39 PM

## 2017-05-02 ENCOUNTER — Other Ambulatory Visit: Payer: Self-pay | Admitting: Obstetrics and Gynecology

## 2017-05-02 ENCOUNTER — Encounter: Payer: Self-pay | Admitting: Obstetrics and Gynecology

## 2017-05-02 ENCOUNTER — Other Ambulatory Visit: Payer: Self-pay

## 2017-05-02 MED ORDER — NALTREXONE-BUPROPION HCL ER 8-90 MG PO TB12: 2.0000 | ORAL_TABLET | Freq: Two times a day (BID) | ORAL | 1 refills | Status: DC

## 2017-05-02 MED ORDER — NALTREXONE-BUPROPION HCL ER 8-90 MG PO TB12: ORAL_TABLET | ORAL | 0 refills | Status: DC

## 2017-05-02 MED ORDER — NALTREXONE-BUPROPION HCL ER 8-90 MG PO TB12: 2.0000 | ORAL_TABLET | Freq: Two times a day (BID) | ORAL | 3 refills | Status: DC

## 2017-08-07 ENCOUNTER — Ambulatory Visit: Payer: Managed Care, Other (non HMO) | Admitting: Obstetrics and Gynecology

## 2018-06-22 ENCOUNTER — Ambulatory Visit: Payer: Managed Care, Other (non HMO) | Admitting: Obstetrics and Gynecology

## 2018-06-27 ENCOUNTER — Ambulatory Visit (INDEPENDENT_AMBULATORY_CARE_PROVIDER_SITE_OTHER): Payer: Self-pay | Admitting: Obstetrics and Gynecology

## 2018-06-27 ENCOUNTER — Encounter: Payer: Self-pay | Admitting: Obstetrics and Gynecology

## 2018-06-27 ENCOUNTER — Other Ambulatory Visit: Payer: Self-pay

## 2018-06-27 VITALS — Ht 69.0 in | Wt 268.0 lb

## 2018-06-27 DIAGNOSIS — Z6839 Body mass index (BMI) 39.0-39.9, adult: Secondary | ICD-10-CM

## 2018-06-27 MED ORDER — PHENTERMINE HCL 37.5 MG PO TABS: 37.5000 mg | ORAL_TABLET | Freq: Every day | ORAL | 0 refills | Status: DC

## 2018-06-27 NOTE — Progress Notes (Signed)
Gynecology Office Visit  Chief Complaint:  Chief Complaint  Patient presents with  . Weight Check    History of Present Illness: Patientis a 33 y.o. [id number] female, who presents for the evaluation of the desire to lose weight. She has gained 49lbs since her last visit.  The patient reports stress being a major impetus to her weight gain.  We had started a course of phentermine given mood stabilizing properties as well as curbing of cravings but she experienced nausea with this and was unable to continue. She has previously been on phentermine and this was switched to diethylpropion after failing to achieve further weight loss on phentermine.  Review of Systems: 10 point review of systems negative unless otherwise noted in HPI  Past Medical History:  Past Medical History:  Diagnosis Date  . Anemia     Past Surgical History:  Past Surgical History:  Procedure Laterality Date  . DILATION AND CURETTAGE OF UTERUS      Gynecologic History: Patient's last menstrual period was 06/16/2018 (exact date).  Obstetric History: [id number]  Family History:  Family History  Problem Relation Age of Onset  . Prostate cancer Father   . Breast cancer Paternal Grandmother 10    Social History:  Social History   Socioeconomic History  . Marital status: Legally Separated    Spouse name: Not on file  . Number of children: Not on file  . Years of education: Not on file  . Highest education level: Not on file  Occupational History  . Not on file  Social Needs  . Financial resource strain: Not on file  . Food insecurity:    Worry: Not on file    Inability: Not on file  . Transportation needs:    Medical: Not on file    Non-medical: Not on file  Tobacco Use  . Smoking status: Never Smoker  . Smokeless tobacco: Never Used  Substance and Sexual Activity  . Alcohol use: Yes    Comment: Occasional  . Drug use: No  . Sexual activity: Yes    Partners: Male    Birth control/protection:  None  Lifestyle  . Physical activity:    Days per week: Not on file    Minutes per session: Not on file  . Stress: Not on file  Relationships  . Social connections:    Talks on phone: Not on file    Gets together: Not on file    Attends religious service: Not on file    Active member of club or organization: Not on file    Attends meetings of clubs or organizations: Not on file    Relationship status: Not on file  . Intimate partner violence:    Fear of current or ex partner: Not on file    Emotionally abused: Not on file    Physically abused: Not on file    Forced sexual activity: Not on file  Other Topics Concern  . Not on file  Social History Narrative  . Not on file    Allergies:  No Known Allergies  Medications: Prior to Admission medications   Not on File    Physical Exam Height 5\' 9"  (1.753 m), weight 268 lb (121.6 kg), last menstrual period 06/16/2018. Wt Readings from Last 3 Encounters:  06/27/18 268 lb (121.6 kg)  05/01/17 219 lb (99.3 kg)  12/14/16 209 lb (94.8 kg)  Body mass index is 39.58 kg/m.   No physical exam as this was a remote  telephone visit to promote social distancing during the current COVID-19 Pandemic  Assessment: 33 y.o. [id number] medical weight loss follow up  Plan: Problem List Items Addressed This Visit    None    Visit Diagnoses    Class 2 severe obesity with serious comorbidity and body mass index (BMI) of 39.0 to 39.9 in adult, unspecified obesity type (HCC)    -  Primary   Relevant Medications   phentermine (ADIPEX-P) 37.5 MG tablet      1) 1500 Calorie ADA Diet  2) Patient education given regarding appropriate lifestyle changes for weight loss including: regular physical activity, healthy coping strategies, caloric restriction and healthy eating patterns.  3) Patient will be started on weight loss medication. The risks and benefits and side effects of medication, such as Adipex (Phenteramine) ,  Tenuate (Diethylproprion),  Belviq (lorcarsin), Contrave (buproprion/naltrexone), Qsymia (phentermine/topiramate), and Saxenda (liraglutide) is discussed. The pros and cons of suppressing appetite and boosting metabolism is discussed. Risks of tolerence and addiction is discussed for selected agents discussed. Use of medicine will ne short term, such as 3-4 months at a time followed by a period of time off of the medicine to avoid these risks and side effects for Adipex, Qsymia, and Tenuate discussed. Pt to call with any negative side effects and agrees to keep follow up appts.  4) Patient to take medication, with the benefits of appetite suppression and metabolism boost d/w pt, along with the side effects and risk factors of long term use that will be avoided with our use of short bursts of therapy. Rx provided.   - restart phentermine  5) Telephone Time 10:52 minutes  6)  Return in about 4 weeks (around 07/25/2018) for medication follow up.    Vena AustriaAndreas Chyann Ambrocio, MD, [facility], [facility] 06/27/2018, 8:30 AM

## 2018-07-23 NOTE — Progress Notes (Signed)
I connected with Brittany Mcpherson on 07/24/18 at  8:10 AM EDT by telephone and verified that I am speaking with the correct person using two identifiers.   I discussed the limitations, risks, security and privacy concerns of performing an evaluation and management service by telephone and the availability of in person appointments. I also discussed with the patient that there may be a patient responsible charge related to this service. The patient expressed understanding and agreed to proceed.  The patient was at home I spoke with the patient from my workstation phone The names of people involved in this encounter were: Brittany RussellJessica R Brossman , and Brittany Mcpherson   Gynecology Office Visit  Chief Complaint:  Chief Complaint  Patient presents with  . Follow-up    all going good per pt    History of Present Illness: Patientis a 33 y.o. [phone] female, who presents for the evaluation of the desire to lose weight. She has lost 9 pounds 1 months. The patient states the following symptoms since starting her weight loss therapy: appetite suppression, energy, and weight loss.  The patient also reports no other ill effects. The patient specifically denies heart palpitations, anxiety, and insomnia.    Review of Systems: 10 point review of systems negative unless otherwise noted in HPI  Past Medical History:  Past Medical History:  Diagnosis Date  . Anemia     Past Surgical History:  Past Surgical History:  Procedure Laterality Date  . DILATION AND CURETTAGE OF UTERUS     Gynecologic History: No LMP recorded.  Obstetric History: [id number]  Family History:  Family History  Problem Relation Age of Onset  . Prostate cancer Father   . Breast cancer Paternal Grandmother [phone]    Social History:  Social History   Socioeconomic History  . Marital status: Legally Separated    Spouse name: Not on file  . Number of children: Not on file  . Years of education: Not on file  . Highest  education level: Not on file  Occupational History  . Not on file  Social Needs  . Financial resource strain: Not on file  . Food insecurity    Worry: Not on file    Inability: Not on file  . Transportation needs    Medical: Not on file    Non-medical: Not on file  Tobacco Use  . Smoking status: Never Smoker  . Smokeless tobacco: Never Used  Substance and Sexual Activity  . Alcohol use: Yes    Comment: Occasional  . Drug use: No  . Sexual activity: Yes    Partners: Male    Birth control/protection: None  Lifestyle  . Physical activity    Days per week: Not on file    Minutes per session: Not on file  . Stress: Not on file  Relationships  . Social Musicianconnections    Talks on phone: Not on file    Gets together: Not on file    Attends religious service: Not on file    Active member of club or organization: Not on file    Attends meetings of clubs or organizations: Not on file    Relationship status: Not on file  . Intimate partner violence    Fear of current or ex partner: Not on file    Emotionally abused: Not on file    Physically abused: Not on file    Forced sexual activity: Not on file  Other Topics Concern  . Not on file  Social History Narrative  . Not on file    Allergies:  No Known Allergies  Medications: Prior to Admission medications   Medication Sig Start Date End Date Taking? Authorizing Provider  phentermine (ADIPEX-P) 37.5 MG tablet Take 1 tablet (37.5 mg total) by mouth daily before breakfast. 06/27/18   [name], MD    Physical Exam There were no vitals taken for this visit. Wt Readings from Last 3 Encounters:  07/24/18 259 lb (117.5 kg)  06/27/18 268 lb (121.6 kg)  05/01/17 219 lb (99.3 kg)   Body mass index is 38.25 kg/m.  No physical exam as this was a remote telephone visit to promote social distancing during the current COVID-19 Pandemic   Assessment: 33 y.o. [id number] medical weight loss follow up  Plan: Problem List Items  Addressed This Visit    None    Visit Diagnoses    Class 2 obesity without serious comorbidity with body mass index (BMI) of 38.0 to 38.9 in adult, unspecified obesity type    -  Primary     1) 1500 Calorie ADA Diet  2) Patient education given regarding appropriate lifestyle changes for weight loss including: regular physical activity, healthy coping strategies, caloric restriction and healthy eating patterns.  3) Patient will be started on weight loss medication. The risks and benefits and side effects of medication, such as Adipex (Phenteramine) ,  Tenuate (Diethylproprion), Belviq (lorcarsin), Contrave (buproprion/naltrexone), Qsymia (phentermine/topiramate), and Saxenda (liraglutide) is discussed. The pros and cons of suppressing appetite and boosting metabolism is discussed. Risks of tolerence and addiction is discussed for selected agents discussed. Use of medicine will ne short term, such as 3-4 months at a time followed by a period of time off of the medicine to avoid these risks and side effects for Adipex, Qsymia, and Tenuate discussed. Pt to call with any negative side effects and agrees to keep follow up appts.  4) Patient to take medication, with the benefits of appetite suppression and metabolism boost d/w pt, along with the side effects and risk factors of long term use that will be avoided with our use of short bursts of therapy. Rx provided.    5) Telephone time:10:45 minutes  6)  No follow-ups on file.   [name], MD, [facility], [facility] 07/24/2018, 8:25 AM

## 2018-07-24 ENCOUNTER — Ambulatory Visit (INDEPENDENT_AMBULATORY_CARE_PROVIDER_SITE_OTHER): Payer: PRIVATE HEALTH INSURANCE | Admitting: Obstetrics and Gynecology

## 2018-07-24 ENCOUNTER — Other Ambulatory Visit: Payer: Self-pay

## 2018-07-24 ENCOUNTER — Encounter: Payer: Self-pay | Admitting: Obstetrics and Gynecology

## 2018-07-24 VITALS — Ht 69.0 in | Wt 259.0 lb

## 2018-07-24 DIAGNOSIS — E669 Obesity, unspecified: Secondary | ICD-10-CM

## 2018-07-24 DIAGNOSIS — Z6838 Body mass index (BMI) 38.0-38.9, adult: Secondary | ICD-10-CM | POA: Diagnosis not present

## 2018-07-24 MED ORDER — PHENTERMINE HCL 37.5 MG PO TABS: 37.5000 mg | ORAL_TABLET | Freq: Every day | ORAL | 0 refills | Status: DC

## 2018-08-02 ENCOUNTER — Other Ambulatory Visit: Payer: Self-pay | Admitting: Obstetrics and Gynecology

## 2018-08-02 ENCOUNTER — Telehealth: Payer: Self-pay | Admitting: Obstetrics and Gynecology

## 2018-08-02 MED ORDER — PHENTERMINE HCL 37.5 MG PO TABS: 37.5000 mg | ORAL_TABLET | Freq: Every day | ORAL | 0 refills | Status: DC

## 2018-08-02 NOTE — Telephone Encounter (Signed)
Rx has been sent it was accidental [facility] to [facility]

## 2018-08-02 NOTE — Telephone Encounter (Signed)
Patient is calling because her phentermine (ADIPEX-P) 37.5 MG tablet is not at [facility]. Please advise Patient had a phone visit on 07/24/18

## 2018-08-21 ENCOUNTER — Ambulatory Visit: Payer: PRIVATE HEALTH INSURANCE | Admitting: Obstetrics and Gynecology

## 2018-08-23 ENCOUNTER — Other Ambulatory Visit: Payer: Self-pay

## 2018-08-23 DIAGNOSIS — Z20822 Contact with and (suspected) exposure to covid-19: Secondary | ICD-10-CM

## 2018-08-27 LAB — NOVEL CORONAVIRUS, NAA: SARS-CoV-2, NAA: NOT DETECTED

## 2018-08-27 LAB — SPECIMEN STATUS REPORT

## 2018-09-04 ENCOUNTER — Ambulatory Visit (INDEPENDENT_AMBULATORY_CARE_PROVIDER_SITE_OTHER): Payer: PRIVATE HEALTH INSURANCE | Admitting: Obstetrics and Gynecology

## 2018-09-04 ENCOUNTER — Other Ambulatory Visit: Payer: Self-pay

## 2018-09-04 VITALS — Wt 252.0 lb

## 2018-09-04 DIAGNOSIS — E669 Obesity, unspecified: Secondary | ICD-10-CM | POA: Diagnosis not present

## 2018-09-04 DIAGNOSIS — Z6837 Body mass index (BMI) 37.0-37.9, adult: Secondary | ICD-10-CM

## 2018-09-04 MED ORDER — PHENTERMINE HCL 37.5 MG PO TABS: 37.5000 mg | ORAL_TABLET | Freq: Every day | ORAL | 0 refills | Status: DC

## 2018-09-04 NOTE — Progress Notes (Signed)
I connected with Brittany Mcpherson on 09/04/18 at 10:10 AM EDT by telephone and verified that I am speaking with the correct person using two identifiers.   I discussed the limitations, risks, security and privacy concerns of performing an evaluation and management service by telephone and the availability of in person appointments. I also discussed with the patient that there may be a patient responsible charge related to this service. The patient expressed understanding and agreed to proceed.  The patient was at home I spoke with the patient from my workstation phone The names of people involved in this encounter were: Brittany Mcpherson , and Brittany Mcpherson   Gynecology Office Visit  Chief Complaint:  Chief Complaint  Patient presents with  . Weight Check    History of Present Illness: Patientis a 33 y.o. [id number] female, who presents for the evaluation of the desire to lose weight. She has lost 7 pounds 1 months. The patient states the following symptoms since starting her weight loss therapy: appetite suppression, energy, and weight loss.  The patient also reports no other ill effects. The patient specifically denies heart palpitations, anxiety, and insomnia.    Review of Systems: 10 point review of systems negative unless otherwise noted in HPI  Past Medical History:  Past Medical History:  Diagnosis Date  . Anemia     Past Surgical History:  Past Surgical History:  Procedure Laterality Date  . DILATION AND CURETTAGE OF UTERUS      Gynecologic History: Patient's last menstrual period was 09/02/2018.  Obstetric History: [id number]  Family History:  Family History  Problem Relation Age of Onset  . Prostate cancer Father   . Breast cancer Paternal Grandmother 55    Social History:  Social History   Socioeconomic History  . Marital status: Legally Separated    Spouse name: Not on file  . Number of children: Not on file  . Years of education: Not on file  .  Highest education level: Not on file  Occupational History  . Not on file  Social Needs  . Financial resource strain: Not on file  . Food insecurity    Worry: Not on file    Inability: Not on file  . Transportation needs    Medical: Not on file    Non-medical: Not on file  Tobacco Use  . Smoking status: Never Smoker  . Smokeless tobacco: Never Used  Substance and Sexual Activity  . Alcohol use: Yes    Comment: Occasional  . Drug use: No  . Sexual activity: Yes    Partners: Male    Birth control/protection: None  Lifestyle  . Physical activity    Days per week: Not on file    Minutes per session: Not on file  . Stress: Not on file  Relationships  . Social Herbalist on phone: Not on file    Gets together: Not on file    Attends religious service: Not on file    Active member of club or organization: Not on file    Attends meetings of clubs or organizations: Not on file    Relationship status: Not on file  . Intimate partner violence    Fear of current or ex partner: Not on file    Emotionally abused: Not on file    Physically abused: Not on file    Forced sexual activity: Not on file  Other Topics Concern  . Not on file  Social History Narrative  .  Not on file    Allergies:  No Known Allergies  Medications: Prior to Admission medications   Medication Sig Start Date End Date Taking? Authorizing Provider  Multiple Vitamins-Minerals (MULTIVITAMIN ADULT PO) multivitamin    [provider]  phentermine (ADIPEX-P) 37.5 MG tablet Take 1 tablet (37.5 mg total) by mouth daily before breakfast. 08/02/18   Vena AustriaStaebler, Yomayra Tate, MD    Physical Exam Weight 252 lb (114.3 kg), last menstrual period 09/02/2018. Wt Readings from Last 3 Encounters:  09/04/18 252 lb (114.3 kg)  07/24/18 259 lb (117.5 kg)  06/27/18 268 lb (121.6 kg)  Body mass index is 37.21 kg/m.  No physical exam as this was a remote telephone visit to promote social distancing during the  current COVID-19 Pandemic   Assessment: 33 y.o. [id number]   Plan: Problem List Items Addressed This Visit    None    Visit Diagnoses    Class 2 obesity without serious comorbidity with body mass index (BMI) of 37.0 to 37.9 in adult, unspecified obesity type    -  Primary   Relevant Medications   phentermine (ADIPEX-P) 37.5 MG tablet      1) 1500 Calorie ADA Diet  2) Patient education given regarding appropriate lifestyle changes for weight loss including: regular physical activity, healthy coping strategies, caloric restriction and healthy eating patterns.  3) Patient will be started on weight loss medication. The risks and benefits and side effects of medication, such as Adipex (Phenteramine) ,  Tenuate (Diethylproprion), Belviq (lorcarsin), Contrave (buproprion/naltrexone), Qsymia (phentermine/topiramate), and Saxenda (liraglutide) is discussed. The pros and cons of suppressing appetite and boosting metabolism is discussed. Risks of tolerence and addiction is discussed for selected agents discussed. Use of medicine will ne short term, such as 3-4 months at a time followed by a period of time off of the medicine to avoid these risks and side effects for Adipex, Qsymia, and Tenuate discussed. Pt to call with any negative side effects and agrees to keep follow up appts.  4) Patient to take medication, with the benefits of appetite suppression and metabolism boost d/w pt, along with the side effects and risk factors of long term use that will be avoided with our use of short bursts of therapy. Rx provided.    5) Telephone time 2:6437min  6)  Return in about 4 weeks (around 10/02/2018) for medication follow up phone.    Vena AustriaAndreas Jamarr Treinen, MD, Evern CoreFACOG [facility], [facility] 09/04/2018, 10:55 AM

## 2018-10-02 ENCOUNTER — Ambulatory Visit: Payer: PRIVATE HEALTH INSURANCE | Admitting: Obstetrics and Gynecology

## 2018-10-30 ENCOUNTER — Ambulatory Visit: Payer: PRIVATE HEALTH INSURANCE | Admitting: Obstetrics and Gynecology

## 2019-03-12 ENCOUNTER — Telehealth: Payer: Self-pay | Admitting: Obstetrics and Gynecology

## 2019-03-12 NOTE — Telephone Encounter (Signed)
Called and left vociemail confirming we would be able to accomodates and next appointment

## 2019-03-12 NOTE — Telephone Encounter (Signed)
Yes we can discuss both

## 2019-03-12 NOTE — Telephone Encounter (Signed)
Patient is schedule for 2/23/21for a weight loss consult follow up. Patient is wanting to know if she could speak with AMS about Birth control options at this appointment also. Please advise

## 2019-03-26 ENCOUNTER — Encounter: Payer: Self-pay | Admitting: Obstetrics and Gynecology

## 2019-03-26 ENCOUNTER — Other Ambulatory Visit: Payer: Self-pay

## 2019-03-26 ENCOUNTER — Ambulatory Visit (INDEPENDENT_AMBULATORY_CARE_PROVIDER_SITE_OTHER): Payer: 59 | Admitting: Obstetrics and Gynecology

## 2019-03-26 VITALS — BP 116/80 | HR 98 | Ht 69.0 in | Wt 267.0 lb

## 2019-03-26 DIAGNOSIS — Z3009 Encounter for other general counseling and advice on contraception: Secondary | ICD-10-CM | POA: Diagnosis not present

## 2019-03-26 DIAGNOSIS — R635 Abnormal weight gain: Secondary | ICD-10-CM | POA: Diagnosis not present

## 2019-03-26 DIAGNOSIS — Z6839 Body mass index (BMI) 39.0-39.9, adult: Secondary | ICD-10-CM | POA: Diagnosis not present

## 2019-03-26 DIAGNOSIS — E66812 Obesity, class 2: Secondary | ICD-10-CM | POA: Insufficient documentation

## 2019-03-26 MED ORDER — PHENTERMINE HCL 37.5 MG PO TABS: 37.5000 mg | ORAL_TABLET | Freq: Every day | ORAL | 0 refills | Status: DC

## 2019-03-26 NOTE — Progress Notes (Signed)
Brittany Office Visit  Chief Complaint:  Chief Complaint  Mcpherson presents with  . Weight Check    Discuss birthcontrol    History of Present Illness: Patientis a 34 y.o. [id number] female, who presents for the evaluation of weight gain. She has gained 15 pounds primarily over 6 months. The Mcpherson states the following issues have contributed to her weight problem: decreased time for physical activity.  The Mcpherson has no additional symptoms. The Mcpherson specifically denies memory loss, muscle weakness, excessive thirst, and polyuria. Weight related co-morbidities include none. The Mcpherson's past medical history is notable for non-contributory. She has tried phentermine interventions in the past with good success.   Mcpherson is a 34 y.o. [id number] presenting for contraception consult.  She is currently on none and desiring to start IUD.  She has a past medical history significant for no contraindication to estrogen.  She specifically denies a history of migraine with aura, chronic hypertension, history of DVT/PE and smoking.  Reported Mcpherson's last menstrual period was 03/05/2019 (exact date)..      Review of Systems: 10 point review of systems negative unless otherwise noted in HPI  Past Medical History:  Past Medical History:  Diagnosis Date  . Anemia     Past Surgical History:  Past Surgical History:  Procedure Laterality Date  . DILATION AND CURETTAGE OF UTERUS      Gynecologic History: Mcpherson's last menstrual period was 03/05/2019 (exact date).  Obstetric History: [id number]  Family History:  Family History  Problem Relation Age of Onset  . Prostate cancer Father   . Breast cancer Paternal Grandmother 4    Social History:  Social History   Socioeconomic History  . Marital status: Legally Separated    Spouse name: Not on file  . Number of children: Not on file  . Years of education: Not on file  . Highest education level: Not on file  Occupational History  . Not  on file  Tobacco Use  . Smoking status: Never Smoker  . Smokeless tobacco: Never Used  Substance and Sexual Activity  . Alcohol use: Yes    Comment: Occasional  . Drug use: No  . Sexual activity: Yes    Partners: Male    Birth control/protection: None  Other Topics Concern  . Not on file  Social History Narrative  . Not on file   Social Determinants of Health   Financial Resource Strain:   . Difficulty of Paying Living Expenses: Not on file  Food Insecurity:   . Worried About Charity fundraiser in the Last Year: Not on file  . Ran Out of Food in the Last Year: Not on file  Transportation Needs:   . Lack of Transportation (Medical): Not on file  . Lack of Transportation (Non-Medical): Not on file  Physical Activity:   . Days of Exercise per Week: Not on file  . Minutes of Exercise per Session: Not on file  Stress:   . Feeling of Stress : Not on file  Social Connections:   . Frequency of Communication with Friends and Family: Not on file  . Frequency of Social Gatherings with Friends and Family: Not on file  . Attends Religious Services: Not on file  . Active Member of Clubs or Organizations: Not on file  . Attends Archivist Meetings: Not on file  . Marital Status: Not on file  Intimate Partner Violence:   . Fear of Current or Ex-Partner: Not on file  . Emotionally Abused:  Not on file  . Physically Abused: Not on file  . Sexually Abused: Not on file    Allergies:  No Known Allergies  Medications: Prior to Admission medications   Medication Sig Start Date End Date Taking? Authorizing Provider  Multiple Vitamins-Minerals (MULTIVITAMIN ADULT PO) multivitamin   Yes [provider]    Physical Exam Blood pressure 116/80, pulse 98, height 5\' 9"  (1.753 m), weight 267 lb (121.1 kg), last menstrual period 03/05/2019. Mcpherson's last menstrual period was 03/05/2019 (exact date).  General: NAD, well nourished, no acute disress HEENT: normocephalic,  anicteric Pulmonary: no increased work of breathing Neurologic: Grossly intact Psychiatric: mood appropriate, affect full  Assessment: 34 y.o. 31 presenting for discussion of weight loss management options  Plan: Problem List Items Addressed This Visit      Other   Class 2 severe obesity with serious comorbidity and body mass index (BMI) of 39.0 to 39.9 in adult [facility])   Relevant Medications   phentermine (ADIPEX-P) 37.5 MG tablet    Other Visit Diagnoses    Encounter for other general counseling and advice on contraception    -  Primary      1) 1500 Calorie ADA Diet  2) Mcpherson education given regarding appropriate lifestyle changes for weight loss including: regular physical activity, healthy coping strategies, caloric restriction and healthy eating patterns.  3) Mcpherson will be started on weight loss medication. The risks and benefits and side effects of medication, such as Adipex (Phenteramine) ,  Tenuate (Diethylproprion), Belviq (lorcarsin), Contrave (buproprion/naltrexone), Qsymia (phentermine/topiramate), and Saxenda (liraglutide) is discussed. The pros and cons of suppressing appetite and boosting metabolism is discussed. Risks of tolerence and addiction is discussed for selected agents discussed. Use of medicine will ne short term, such as 3-4 months at a time followed by a period of time off of the medicine to avoid these risks and side effects for Adipex, Qsymia, and Tenuate discussed. Pt to call with any negative side effects and agrees to keep follow up appts.  4) Comorbidity Screening - hypothyroidism screening, diabetes, and hyperlipidemia screening not offered  5) Encouraged weekly weight monitorig to track progress and sample 1 week food diary  6) Contraception - discussed that all weight loss drugs fall in to pregnancy category X, Mcpherson is interested in starting Mirena IUD for contraception.  Will plan on placement at time of next menses.  Risks and benefits of  IUD discussed including the risks of irregular bleeding, cramping, infection, malpositioning, expulsion or uterine perforation of the IUD (1:1000 placements)  which may require further procedures such as laparoscopy.  IUDs while effective at preventing pregnancy do not prevent transmission of sexually transmitted diseases and use of barrier methods for this purpose was discussed.  Low overall incidence of failure with 99.7% efficacy rate in typical use.  The Mcpherson has not contraindication to IUD placement.  7) 15 minutes face-to-face; counseling/coordination of care > 50 percent of visit  8) Return in about 4 weeks (around 04/23/2019) for medication follow up (call with next cycle Mirena placement).   04/25/2019, MD, [name], [facility] 03/26/2019, 11:32 AM

## 2019-04-08 ENCOUNTER — Telehealth: Payer: Self-pay | Admitting: Obstetrics and Gynecology

## 2019-04-08 NOTE — Telephone Encounter (Signed)
Patient scheduled 3/9 for Mirena placement with AMS/KP.

## 2019-04-08 NOTE — Telephone Encounter (Signed)
Noted. Mirena reserved for this patient. 

## 2019-04-09 ENCOUNTER — Other Ambulatory Visit: Payer: Self-pay

## 2019-04-09 ENCOUNTER — Ambulatory Visit (INDEPENDENT_AMBULATORY_CARE_PROVIDER_SITE_OTHER): Payer: 59 | Admitting: Obstetrics and Gynecology

## 2019-04-09 ENCOUNTER — Encounter: Payer: Self-pay | Admitting: Obstetrics and Gynecology

## 2019-04-09 VITALS — BP 122/84 | HR 77 | Ht 69.0 in | Wt 260.0 lb

## 2019-04-09 DIAGNOSIS — Z30430 Encounter for insertion of intrauterine contraceptive device: Secondary | ICD-10-CM

## 2019-04-09 NOTE — Progress Notes (Signed)
   GYNECOLOGY OFFICE PROCEDURE NOTE  KEIASIA CHRISTIANSON is a 34 y.o. [phone] here for a Mirena IUD insertion. No GYN concerns.   LMP is Patient's last menstrual period was 04/06/2019..  The indication for her IUD is contraception/cycle control.  IUD Insertion Procedure Note Patient identified, informed consent performed, consent signed.   Discussed risks of irregular bleeding, cramping, infection, malpositioning, expulsion or uterine perforation of the IUD (1:1000 placements)  which may require further procedure such as laparoscopy.  IUD while effective at preventing pregnancy do not prevent transmission of sexually transmitted diseases and use of barrier methods for this purpose was discussed. Time out was performed.  Urine pregnancy test negative.  Speculum placed in the vagina.  Cervix visualized.  Cleaned with Betadine x 2.  Grasped anteriorly with a single tooth tenaculum.  Uterus sounded to 9.5 cm. IUD placed per manufacturer's recommendations.  Dilator were used secondary to some resistance at the internal cervical osStrings trimmed to 3 cm. Tenaculum was removed, good hemostasis noted.  Patient tolerated procedure well.   Patient was given post-procedure instructions.  She was advised to have backup contraception for one week.  Patient was also asked to check IUD strings periodically and follow up in 6 weeks for IUD check.  [name], MD, [facility], [facility]

## 2019-04-29 NOTE — Telephone Encounter (Signed)
Mirena rcvd/charged 04/09/2019

## 2019-05-13 ENCOUNTER — Other Ambulatory Visit: Payer: Self-pay

## 2019-05-13 ENCOUNTER — Encounter: Payer: Self-pay | Admitting: Obstetrics and Gynecology

## 2019-05-13 ENCOUNTER — Ambulatory Visit (INDEPENDENT_AMBULATORY_CARE_PROVIDER_SITE_OTHER): Payer: 59 | Admitting: Obstetrics and Gynecology

## 2019-05-13 VITALS — BP 104/62 | HR 76 | Ht 69.0 in | Wt 256.0 lb

## 2019-05-13 DIAGNOSIS — Z30431 Encounter for routine checking of intrauterine contraceptive device: Secondary | ICD-10-CM | POA: Diagnosis not present

## 2019-05-13 DIAGNOSIS — E669 Obesity, unspecified: Secondary | ICD-10-CM | POA: Diagnosis not present

## 2019-05-13 DIAGNOSIS — Z6837 Body mass index (BMI) 37.0-37.9, adult: Secondary | ICD-10-CM

## 2019-05-13 MED ORDER — PHENTERMINE HCL 37.5 MG PO TABS: 37.5000 mg | ORAL_TABLET | Freq: Every day | ORAL | 0 refills | Status: DC

## 2019-05-13 NOTE — Progress Notes (Signed)
Obstetrics & Gynecology Office Visit   Chief Complaint:  Chief Complaint  Patient presents with  . Follow-up    IUD string check  . Weight Check    History of Present Illness: 34 y.o. patient presenting for follow up of Mirena IUD placement 4 weeks ago.  The indication for her IUD was contraception.  She denies any complications since her IUD placement.  Still having some occasional spotting.  is not able to feel strings.    Patientis a 34 y.o. [phone] female, who presents for the evaluation of the desire to lose weight. She has lost 4 pounds in 1 months. The patient states the following symptoms since starting her weight loss therapy: appetite suppression, energy, and weight loss.  The patient also reports no other ill effects. The patient specifically denies heart palpitations, anxiety, and insomnia.    Review of Systems: Review of Systems  Constitutional: Negative.   Gastrointestinal: Negative.   Genitourinary: Negative.   Neurological: Negative for headaches.    Past Medical History:  Past Medical History:  Diagnosis Date  . Anemia     Past Surgical History:  Past Surgical History:  Procedure Laterality Date  . DILATION AND CURETTAGE OF UTERUS      Gynecologic History: Patient's last menstrual period was 05/06/2019.  Obstetric History: [id number]  Family History:  Family History  Problem Relation Age of Onset  . Prostate cancer Father   . Breast cancer Paternal Grandmother 51    Social History:  Social History   Socioeconomic History  . Marital status: Legally Separated    Spouse name: Not on file  . Number of children: Not on file  . Years of education: Not on file  . Highest education level: Not on file  Occupational History  . Not on file  Tobacco Use  . Smoking status: Never Smoker  . Smokeless tobacco: Never Used  Substance and Sexual Activity  . Alcohol use: Yes    Comment: Occasional  . Drug use: No  . Sexual activity: Yes    Partners:  Male    Birth control/protection: I.U.D.  Other Topics Concern  . Not on file  Social History Narrative  . Not on file   Social Determinants of Health   Financial Resource Strain:   . Difficulty of Paying Living Expenses:   Food Insecurity:   . Worried About Programme researcher, broadcasting/film/video in the Last Year:   . Barista in the Last Year:   Transportation Needs:   . Freight forwarder (Medical):   Marland Kitchen Lack of Transportation (Non-Medical):   Physical Activity:   . Days of Exercise per Week:   . Minutes of Exercise per Session:   Stress:   . Feeling of Stress :   Social Connections:   . Frequency of Communication with Friends and Family:   . Frequency of Social Gatherings with Friends and Family:   . Attends Religious Services:   . Active Member of Clubs or Organizations:   . Attends Banker Meetings:   Marland Kitchen Marital Status:   Intimate Partner Violence:   . Fear of Current or Ex-Partner:   . Emotionally Abused:   Marland Kitchen Physically Abused:   . Sexually Abused:     Allergies:  No Known Allergies  Medications: Prior to Admission medications   Medication Sig Start Date End Date Taking? Authorizing Provider  levonorgestrel (MIRENA) 20 MCG/24HR IUD 1 each by Intrauterine route once.   Yes [provider]  Multiple Vitamins-Minerals (MULTIVITAMIN ADULT PO) multivitamin   Yes [provider]  phentermine (ADIPEX-P) 37.5 MG tablet Take 1 tablet (37.5 mg total) by mouth daily before breakfast. 05/13/19  Yes [name], MD    Physical Exam Blood pressure 104/62, pulse 76, height 5\' 9"  (1.753 m), weight 256 lb (116.1 kg), last menstrual period 05/06/2019. Patient's last menstrual period was 05/06/2019.  General: NAD HEENT: normocephalic, anicteric Pulmonary: No increased work of breathing  Genitourinary:  External: Normal external female genitalia.  Normal urethral meatus, normal  Bartholin's and Skene's glands.    Vagina: Normal vaginal mucosa, no  evidence of prolapse.    Cervix: Grossly normal in appearance, no bleeding, IUD strings visualized 2cm  Uterus: Non-enlarged, mobile, normal contour.  No CMT  Adnexa: ovaries non-enlarged, no adnexal masses  Rectal: deferred  Lymphatic: no evidence of inguinal lymphadenopathy Extremities: no edema, erythema, or tenderness Neurologic: Grossly intact Psychiatric: mood appropriate, affect full  Female chaperone present for pelvic and breast  portions of the physical exam  Assessment: 34 y.o. [id number] No problem-specific Assessment & Plan notes found for this encounter.   Plan: Problem List Items Addressed This Visit    None    Visit Diagnoses    Class 2 obesity without serious comorbidity with body mass index (BMI) of 37.0 to 37.9 in adult, unspecified obesity type    -  Primary   Relevant Medications   phentermine (ADIPEX-P) 37.5 MG tablet   IUD check up           1.  The patient was given instructions to check her IUD strings monthly and call with any problems or concerns.  She should call for fevers, chills, abnormal vaginal discharge, pelvic pain, or other complaints.  2.   IUDs while effective at preventing pregnancy do not prevent transmission of sexually transmitted diseases and use of barrier methods for this purpose was discussed.  Low overall incidence of failure with 99.7% efficacy rate in typical use.  The patient has not contraindication to IUD placement.  3.  She will return for a annual exam in 1 year.  All questions answered.  4) Medical weight loss - tolerating phentermine with weight loss in progress. - Continue phentermine  5) A total of 15 minutes were spent in face-to-face contact with the patient during this encounter with over half of that time devoted to counseling and coordination of care.  6) Return in about 4 weeks (around 06/10/2019) for weight loss follow up (telephone, video, or in person).   [name], MD, [facility], [facility] 05/13/2019, 12:13 PM

## 2019-06-10 ENCOUNTER — Encounter: Payer: Self-pay | Admitting: Obstetrics and Gynecology

## 2019-06-10 ENCOUNTER — Ambulatory Visit (INDEPENDENT_AMBULATORY_CARE_PROVIDER_SITE_OTHER): Payer: 59 | Admitting: Obstetrics and Gynecology

## 2019-06-10 ENCOUNTER — Other Ambulatory Visit: Payer: Self-pay

## 2019-06-10 VITALS — BP 124/66 | HR 92 | Ht 69.0 in | Wt 249.8 lb

## 2019-06-10 DIAGNOSIS — E669 Obesity, unspecified: Secondary | ICD-10-CM | POA: Diagnosis not present

## 2019-06-10 DIAGNOSIS — Z6836 Body mass index (BMI) 36.0-36.9, adult: Secondary | ICD-10-CM | POA: Diagnosis not present

## 2019-06-10 MED ORDER — PHENTERMINE HCL 37.5 MG PO TABS: 37.5000 mg | ORAL_TABLET | Freq: Every day | ORAL | 0 refills | Status: DC

## 2019-06-10 NOTE — Progress Notes (Signed)
Gynecology Office Visit  Chief Complaint:  Chief Complaint  Patient presents with  . Weight Check    History of Present Illness: Patientis a 34 y.o. [id number] female, who presents for the evaluation of the desire to lose weight. She has lost 7 pounds 1 months, for 11 lbs weight loss in the last 2 months. The patient states the following symptoms since starting her weight loss therapy: appetite suppression, energy, and weight loss.  The patient also reports no other ill effects. The patient specifically denies heart palpitations, anxiety, and insomnia. Still only taking 1/2 pill.   Review of Systems: 10 point review of systems negative unless otherwise noted in HPI  Past Medical History:  Past Medical History:  Diagnosis Date  . Anemia     Past Surgical History:  Past Surgical History:  Procedure Laterality Date  . DILATION AND CURETTAGE OF UTERUS      Gynecologic History: No LMP recorded. (Menstrual status: IUD).  Obstetric History: [id number]  Family History:  Family History  Problem Relation Age of Onset  . Prostate cancer Father   . Breast cancer Paternal Grandmother 93    Social History:  Social History   Socioeconomic History  . Marital status: Legally Separated    Spouse name: Not on file  . Number of children: Not on file  . Years of education: Not on file  . Highest education level: Not on file  Occupational History  . Not on file  Tobacco Use  . Smoking status: Never Smoker  . Smokeless tobacco: Never Used  Substance and Sexual Activity  . Alcohol use: Yes    Comment: Occasional  . Drug use: No  . Sexual activity: Yes    Partners: Male    Birth control/protection: I.U.D.  Other Topics Concern  . Not on file  Social History Narrative  . Not on file   Social Determinants of Health   Financial Resource Strain:   . Difficulty of Paying Living Expenses:   Food Insecurity:   . Worried About Programme researcher, broadcasting/film/video in the Last Year:   . Engineer, site in the Last Year:   Transportation Needs:   . Freight forwarder (Medical):   Marland Kitchen Lack of Transportation (Non-Medical):   Physical Activity:   . Days of Exercise per Week:   . Minutes of Exercise per Session:   Stress:   . Feeling of Stress :   Social Connections:   . Frequency of Communication with Friends and Family:   . Frequency of Social Gatherings with Friends and Family:   . Attends Religious Services:   . Active Member of Clubs or Organizations:   . Attends Banker Meetings:   Marland Kitchen Marital Status:   Intimate Partner Violence:   . Fear of Current or Ex-Partner:   . Emotionally Abused:   Marland Kitchen Physically Abused:   . Sexually Abused:     Allergies:  No Known Allergies  Medications: Prior to Admission medications   Medication Sig Start Date End Date Taking? Authorizing Provider  levonorgestrel (MIRENA) 20 MCG/24HR IUD 1 each by Intrauterine route once.   Yes [provider]  Multiple Vitamins-Minerals (MULTIVITAMIN ADULT PO) multivitamin   Yes [provider]  phentermine (ADIPEX-P) 37.5 MG tablet Take 1 tablet (37.5 mg total) by mouth daily before breakfast. 05/13/19  Yes [name], MD    Physical Exam Blood pressure 124/66, pulse 92, height 5\' 9"  (1.753 m), weight 249 lb 12.8 oz (  113.3 kg). Wt Readings from Last 3 Encounters:  06/10/19 249 lb 12.8 oz (113.3 kg)  05/13/19 256 lb (116.1 kg)  04/09/19 260 lb (117.9 kg)  Body mass index is 36.89 kg/m.  General: NAD HEENT: normocephalic, anicteric Pulmonary: no increased work of breathing Neurologic: Grossly intact Psychiatric: mood appropriate, affect full  Assessment: 34 y.o. [id number] presenting for weight loss follow up  Plan: Problem List Items Addressed This Visit    None      1) 1500 Calorie ADA Diet  2) Patient education given regarding appropriate lifestyle changes for weight loss including: regular physical activity, healthy coping strategies, caloric  restriction and healthy eating patterns.  3) Patient will be started on weight loss medication. The risks and benefits and side effects of medication, such as Adipex (Phenteramine) ,  Tenuate (Diethylproprion), Belviq (lorcarsin), Contrave (buproprion/naltrexone), Qsymia (phentermine/topiramate), and Saxenda (liraglutide) is discussed. The pros and cons of suppressing appetite and boosting metabolism is discussed. Risks of tolerence and addiction is discussed for selected agents discussed. Use of medicine will ne short term, such as 3-4 months at a time followed by a period of time off of the medicine to avoid these risks and side effects for Adipex, Qsymia, and Tenuate discussed. Pt to call with any negative side effects and agrees to keep follow up appts.  4) Patient to take medication, with the benefits of appetite suppression and metabolism boost d/w pt, along with the side effects and risk factors of long term use that will be avoided with our use of short bursts of therapy. Rx provided.    5) 15 minutes face-to-face; with counseling/coordination of care > 50 percent of visit related to obesity and ongoing management/treatment   6)    [name], MD, [place], [place] 06/10/2019, 9:14 AM

## 2019-07-15 ENCOUNTER — Ambulatory Visit: Payer: 59 | Admitting: Obstetrics and Gynecology

## 2019-07-30 ENCOUNTER — Other Ambulatory Visit: Payer: Self-pay

## 2019-07-30 ENCOUNTER — Encounter: Payer: Self-pay | Admitting: Obstetrics and Gynecology

## 2019-07-30 ENCOUNTER — Ambulatory Visit (INDEPENDENT_AMBULATORY_CARE_PROVIDER_SITE_OTHER): Payer: 59 | Admitting: Obstetrics and Gynecology

## 2019-07-30 VITALS — Ht 69.0 in | Wt 245.0 lb

## 2019-07-30 DIAGNOSIS — Z6836 Body mass index (BMI) 36.0-36.9, adult: Secondary | ICD-10-CM | POA: Diagnosis not present

## 2019-07-30 DIAGNOSIS — E669 Obesity, unspecified: Secondary | ICD-10-CM

## 2019-07-30 MED ORDER — PHENTERMINE HCL 37.5 MG PO TABS: 37.5000 mg | ORAL_TABLET | Freq: Every day | ORAL | 0 refills | Status: DC

## 2019-07-30 NOTE — Progress Notes (Signed)
I connected with Brittany Mcpherson   on 07/30/2019 at  9:30 AM EDT by telephone and verified that I am speaking with the correct person using two identifiers.   I discussed the limitations, risks, security and privacy concerns of performing an evaluation and management service by telephone and the availability of in person appointments. I also discussed with the patient that there may be a patient responsible charge related to this service. The patient expressed understanding and agreed to proceed.  The patient was at home I spoke with the patient from my workstation phone The names of people involved in this encounter were: Brittany Mcpherson , and Brittany Mcpherson   Gynecology Office Visit  Chief Complaint:  Chief Complaint  Patient presents with  . Weight Check    History of Present Illness: Patientis a 34 y.o. [id number] female, who presents for the evaluation of the desire to lose weight. She has lost 4 pounds 1 months. The patient states the following symptoms since starting her weight loss therapy: appetite suppression, energy, and weight loss.  The patient also reports no other ill effects. The patient specifically denies heart palpitations, anxiety, and insomnia.   Review of Systems: 10 point review of systems negative unless otherwise noted in HPI  Past Medical History:  Past Medical History:  Diagnosis Date  . Anemia     Past Surgical History:  Past Surgical History:  Procedure Laterality Date  . DILATION AND CURETTAGE OF UTERUS      Gynecologic History: No LMP recorded. (Menstrual status: IUD).  Obstetric History: [id number]  Family History:  Family History  Problem Relation Age of Onset  . Prostate cancer Father   . Breast cancer Paternal Grandmother 6    Social History:  Social History   Socioeconomic History  . Marital status: Legally Separated    Spouse name: Not on file  . Number of children: Not on file  . Years of education: Not on file  . Highest  education level: Not on file  Occupational History  . Not on file  Tobacco Use  . Smoking status: Never Smoker  . Smokeless tobacco: Never Used  Vaping Use  . Vaping Use: Never used  Substance and Sexual Activity  . Alcohol use: Yes    Comment: Occasional  . Drug use: No  . Sexual activity: Yes    Partners: Male    Birth control/protection: I.U.D.  Other Topics Concern  . Not on file  Social History Narrative  . Not on file   Social Determinants of Health   Financial Resource Strain:   . Difficulty of Paying Living Expenses:   Food Insecurity:   . Worried About Programme researcher, broadcasting/film/video in the Last Year:   . Barista in the Last Year:   Transportation Needs:   . Freight forwarder (Medical):   Marland Kitchen Lack of Transportation (Non-Medical):   Physical Activity:   . Days of Exercise per Week:   . Minutes of Exercise per Session:   Stress:   . Feeling of Stress :   Social Connections:   . Frequency of Communication with Friends and Family:   . Frequency of Social Gatherings with Friends and Family:   . Attends Religious Services:   . Active Member of Clubs or Organizations:   . Attends Banker Meetings:   Marland Kitchen Marital Status:   Intimate Partner Violence:   . Fear of Current or Ex-Partner:   . Emotionally Abused:   .  Physically Abused:   . Sexually Abused:     Allergies:  No Known Allergies  Medications: Prior to Admission medications   Medication Sig Start Date End Date Taking? Authorizing Provider  levonorgestrel (MIRENA) 20 MCG/24HR IUD 1 each by Intrauterine route once.   Yes [provider]  Multiple Vitamins-Minerals (MULTIVITAMIN ADULT PO) multivitamin   Yes [provider]  phentermine (ADIPEX-P) 37.5 MG tablet Take 1 tablet (37.5 mg total) by mouth daily before breakfast. 06/10/19  Yes Brittany Austria, MD    Physical Exam Height 5\' 9"  (1.753 m), weight 245 lb (111.1 kg). Wt Readings from Last 3 Encounters:  07/30/19 245 lb  (111.1 kg)  06/10/19 249 lb 12.8 oz (113.3 kg)  05/13/19 256 lb (116.1 kg)  Body mass index is 36.18 kg/m.  No physical exam as this was a remote telephone visit to promote social distancing during the current COVID-19 Pandemic   Assessment: 34 y.o. [id number]  Plan: Problem List Items Addressed This Visit    None    Visit Diagnoses    Class 2 obesity without serious comorbidity with body mass index (BMI) of 36.0 to 36.9 in adult, unspecified obesity type    -  Primary   Relevant Medications   phentermine (ADIPEX-P) 37.5 MG tablet      1) 1500 Calorie ADA Diet  2) Patient education given regarding appropriate lifestyle changes for weight loss including: regular physical activity, healthy coping strategies, caloric restriction and healthy eating patterns.  3) Patient to take medication, with the benefits of appetite suppression and metabolism boost d/w pt, along with the side effects and risk factors of long term use that will be avoided with our use of short bursts of therapy. Rx provided.    4) Telephone Time 5 minutes  5)  Return in about 4 weeks (around 08/27/2019) for medication follow up phone.    08/29/2019, MD, Brittany Mcpherson [facility], [facility] 07/30/2019, 10:29 AM

## 2019-10-03 ENCOUNTER — Ambulatory Visit (INDEPENDENT_AMBULATORY_CARE_PROVIDER_SITE_OTHER): Payer: 59 | Admitting: Obstetrics and Gynecology

## 2019-10-03 ENCOUNTER — Other Ambulatory Visit (HOSPITAL_COMMUNITY)
Admission: RE | Admit: 2019-10-03 | Discharge: 2019-10-03 | Disposition: A | Discharge status: Home / Self Care | Payer: Self-pay | Source: Ambulatory Visit | Attending: Obstetrics and Gynecology | Admitting: Obstetrics and Gynecology

## 2019-10-03 ENCOUNTER — Other Ambulatory Visit: Payer: Self-pay

## 2019-10-03 ENCOUNTER — Encounter: Payer: Self-pay | Admitting: Obstetrics and Gynecology

## 2019-10-03 VITALS — BP 130/80 | Ht 69.0 in | Wt 249.0 lb

## 2019-10-03 DIAGNOSIS — Z30432 Encounter for removal of intrauterine contraceptive device: Secondary | ICD-10-CM

## 2019-10-03 DIAGNOSIS — Z113 Encounter for screening for infections with a predominantly sexual mode of transmission: Secondary | ICD-10-CM

## 2019-10-03 DIAGNOSIS — Z30011 Encounter for initial prescription of contraceptive pills: Secondary | ICD-10-CM | POA: Diagnosis not present

## 2019-10-03 MED ORDER — NORETHINDRONE ACET-ETHINYL EST 1-20 MG-MCG PO TABS: 1.0000 | ORAL_TABLET | Freq: Every day | ORAL | 0 refills | Status: DC

## 2019-10-03 NOTE — Progress Notes (Signed)
Brittany Mina, MD   Chief Complaint  Patient presents with  . Contraception    can not feel the IUD strings,AUB    HPI:      [name] is a 34 y.o. [id number] whose LMP was No LMP recorded. (Menstrual status: IUD)., presents today for IUD check. Mirena inserted 04/09/19. She can't feel the strings this wk. Menses are random, lasting 4-14 days, light flow, no dysmen. She has had a heavier flow this month than usual with more cramping. She just isn't sure she wants to keep the IUD. Did OCPs in past and interested in restarting them for better cycle control. No hx of HTN, DVTs, migraines with aura.  She would also like STD testing. Has new sex partner. Sometimes with bleeding.    Past Medical History:  Diagnosis Date  . Anemia     Past Surgical History:  Procedure Laterality Date  . DILATION AND CURETTAGE OF UTERUS      Family History  Problem Relation Age of Onset  . Prostate cancer Father   . Breast cancer Paternal Grandmother 46    Social History   Socioeconomic History  . Marital status: Legally Separated    Spouse name: Not on file  . Number of children: Not on file  . Years of education: Not on file  . Highest education level: Not on file  Occupational History  . Not on file  Tobacco Use  . Smoking status: Never Smoker  . Smokeless tobacco: Never Used  Vaping Use  . Vaping Use: Never used  Substance and Sexual Activity  . Alcohol use: Yes    Comment: Occasional  . Drug use: No  . Sexual activity: Yes    Partners: Male    Birth control/protection: I.U.D.  Other Topics Concern  . Not on file  Social History Narrative  . Not on file   Social Determinants of Health   Financial Resource Strain:   . Difficulty of Paying Living Expenses: Not on file  Food Insecurity:   . Worried About Programme researcher, broadcasting/film/video in the Last Year: Not on file  . Ran Out of Food in the Last Year: Not on file  Transportation Needs:   . Lack of Transportation (Medical):  Not on file  . Lack of Transportation (Non-Medical): Not on file  Physical Activity:   . Days of Exercise per Week: Not on file  . Minutes of Exercise per Session: Not on file  Stress:   . Feeling of Stress : Not on file  Social Connections:   . Frequency of Communication with Friends and Family: Not on file  . Frequency of Social Gatherings with Friends and Family: Not on file  . Attends Religious Services: Not on file  . Active Member of Clubs or Organizations: Not on file  . Attends Banker Meetings: Not on file  . Marital Status: Not on file  Intimate Partner Violence:   . Fear of Current or Ex-Partner: Not on file  . Emotionally Abused: Not on file  . Physically Abused: Not on file  . Sexually Abused: Not on file    Outpatient Medications Prior to Visit  Medication Sig Dispense Refill  . levonorgestrel (MIRENA) 20 MCG/24HR IUD 1 each by Intrauterine route once.    . Multiple Vitamins-Minerals (MULTIVITAMIN ADULT PO) multivitamin    . phentermine (ADIPEX-P) 37.5 MG tablet Take 1 tablet (37.5 mg total) by mouth daily before breakfast. (Patient not taking: Reported on 10/03/2019)  30 tablet 0   No facility-administered medications prior to visit.      ROS:  Review of Systems  Constitutional: Negative for fever.  Gastrointestinal: Negative for blood in stool, constipation, diarrhea, nausea and vomiting.  Genitourinary: Positive for menstrual problem. Negative for dyspareunia, dysuria, flank pain, frequency, hematuria, urgency, vaginal bleeding, vaginal discharge and vaginal pain.  Musculoskeletal: Negative for back pain.  Skin: Negative for rash.   BREAST: No symptoms   OBJECTIVE:   Vitals:  BP 130/80   Ht 5\' 9"  (1.753 m)   Wt 249 lb (112.9 kg)   BMI 36.77 kg/m   Physical Exam Vitals reviewed.  Constitutional:      Appearance: She is well-developed.  Pulmonary:     Effort: Pulmonary effort is normal.  Genitourinary:    General: Normal vulva.      Pubic Area: No rash.      Labia:        Right: No rash, tenderness or lesion.        Left: No rash, tenderness or lesion.      Vagina: Bleeding present. No vaginal discharge, erythema or tenderness.     Cervix: Normal.     Uterus: Normal. Not enlarged and not tender.      Adnexa: Right adnexa normal and left adnexa normal.       Right: No mass or tenderness.         Left: No mass or tenderness.       Comments: IUD STRINGS IN CX OS Musculoskeletal:        General: Normal range of motion.     Cervical back: Normal range of motion.  Skin:    General: Skin is warm and dry.  Neurological:     General: No focal deficit present.     Mental Status: She is alert and oriented to person, place, and time.  Psychiatric:        Mood and Affect: Mood normal.        Behavior: Behavior normal.        Thought Content: Thought content normal.        Judgment: Judgment normal.     IUD Removal Strings of IUD identified and grasped.  IUD removed without problem with ring forceps.  Pt tolerated this well.  IUD noted to be intact.   Assessment/Plan: Encounter for initial prescription of contraceptive pills - Plan: norethindrone-ethinyl estradiol (LOESTRIN 1/20, 21,) 1-20 MG-MCG tablet; OCP start today, Rx eRxd. Condoms for 1 wk. Has annual in 2 wks.   Encounter for IUD removal--tolerated well  Screening for STD (sexually transmitted disease) - Plan: Cervicovaginal ancillary only    Meds ordered this encounter  Medications  . norethindrone-ethinyl estradiol (LOESTRIN 1/20, 21,) 1-20 MG-MCG tablet    Sig: Take 1 tablet by mouth daily.    Dispense:  28 tablet    Refill:  0    Order Specific Question:   Supervising Provider    Answer:   2/20 Nadara Mustard      Return if symptoms worsen or fail to improve.  Nerine Pulse B. Barbarann Kelly, PA-C 10/03/2019 11:48 AM

## 2019-10-03 NOTE — Patient Instructions (Signed)
I value your feedback and entrusting us with your care. If you get a [facility] patient survey, I would appreciate you taking the time to let us know about your experience today. Thank you!  As of January 10, 2019, your lab results will be released to your MyChart immediately, before I even have a chance to see them. Please give me time to review them and contact you if there are any abnormalities. Thank you for your patience.  

## 2019-10-04 LAB — CERVICOVAGINAL ANCILLARY ONLY
Chlamydia: NEGATIVE
Comment: NEGATIVE
Comment: NORMAL
Neisseria Gonorrhea: NEGATIVE

## 2019-10-14 ENCOUNTER — Encounter: Payer: Self-pay | Admitting: Obstetrics and Gynecology

## 2019-10-14 ENCOUNTER — Ambulatory Visit (INDEPENDENT_AMBULATORY_CARE_PROVIDER_SITE_OTHER): Payer: 59 | Admitting: Obstetrics and Gynecology

## 2019-10-14 ENCOUNTER — Other Ambulatory Visit: Payer: Self-pay

## 2019-10-14 VITALS — BP 104/66 | Ht 69.0 in | Wt 249.0 lb

## 2019-10-14 DIAGNOSIS — Z131 Encounter for screening for diabetes mellitus: Secondary | ICD-10-CM | POA: Diagnosis not present

## 2019-10-14 DIAGNOSIS — Z13220 Encounter for screening for lipoid disorders: Secondary | ICD-10-CM

## 2019-10-14 DIAGNOSIS — E669 Obesity, unspecified: Secondary | ICD-10-CM

## 2019-10-14 DIAGNOSIS — Z1329 Encounter for screening for other suspected endocrine disorder: Secondary | ICD-10-CM | POA: Diagnosis not present

## 2019-10-14 DIAGNOSIS — Z6836 Body mass index (BMI) 36.0-36.9, adult: Secondary | ICD-10-CM

## 2019-10-14 MED ORDER — PHENTERMINE HCL 37.5 MG PO TABS: 37.5000 mg | ORAL_TABLET | Freq: Every day | ORAL | 0 refills | Status: DC

## 2019-10-14 NOTE — Progress Notes (Signed)
Gynecology Office Visit  Chief Complaint:  Chief Complaint  Patient presents with  . Weight Check    History of Present Illness: Patientis a 34 y.o. [id number] female, who presents for the evaluation of weight gain. She has gained 0 pounds in the last 2.5 months since her last phentermine course. The patient states the following issues have contributed to her weight problem: decreased time for physical activity.  The patient has no additional symptoms. The patient specifically denies memory loss, muscle weakness, excessive thirst, and polyuria. Weight related co-morbidities include none. The patient's past medical history is notable for none. She has tried phentermine interventions in the past with good success.   Review of Systems: 10 point review of systems negative unless otherwise noted in HPI  Past Medical History:  Patient Active Problem List   Diagnosis Date Noted  . Class 2 severe obesity with serious comorbidity and body mass index (BMI) of 39.0 to 39.9 in adult [facility]) 03/26/2019    Past Surgical History:  Past Surgical History:  Procedure Laterality Date  . DILATION AND CURETTAGE OF UTERUS      Gynecologic History: No LMP recorded. (Menstrual status: Oral contraceptives).  Obstetric History: [id number]  Family History:  Family History  Problem Relation Age of Onset  . Prostate cancer Father   . Breast cancer Paternal Grandmother 45    Social History:  Social History   Socioeconomic History  . Marital status: Legally Separated    Spouse name: Not on file  . Number of children: Not on file  . Years of education: Not on file  . Highest education level: Not on file  Occupational History  . Not on file  Tobacco Use  . Smoking status: Never Smoker  . Smokeless tobacco: Never Used  Vaping Use  . Vaping Use: Never used  Substance and Sexual Activity  . Alcohol use: Yes    Comment: Occasional  . Drug use: No  . Sexual activity: Yes    Partners: Male    Birth  control/protection: Pill  Other Topics Concern  . Not on file  Social History Narrative  . Not on file   Social Determinants of Health   Financial Resource Strain:   . Difficulty of Paying Living Expenses: Not on file  Food Insecurity:   . Worried About Programme researcher, broadcasting/film/video in the Last Year: Not on file  . Ran Out of Food in the Last Year: Not on file  Transportation Needs:   . Lack of Transportation (Medical): Not on file  . Lack of Transportation (Non-Medical): Not on file  Physical Activity:   . Days of Exercise per Week: Not on file  . Minutes of Exercise per Session: Not on file  Stress:   . Feeling of Stress : Not on file  Social Connections:   . Frequency of Communication with Friends and Family: Not on file  . Frequency of Social Gatherings with Friends and Family: Not on file  . Attends Religious Services: Not on file  . Active Member of Clubs or Organizations: Not on file  . Attends Banker Meetings: Not on file  . Marital Status: Not on file  Intimate Partner Violence:   . Fear of Current or Ex-Partner: Not on file  . Emotionally Abused: Not on file  . Physically Abused: Not on file  . Sexually Abused: Not on file    Allergies:  No Known Allergies  Medications: Prior to Admission medications   Medication Sig Start  Date End Date Taking? Authorizing Provider  Multiple Vitamins-Minerals (MULTIVITAMIN ADULT PO) multivitamin   Yes [provider]  norethindrone-ethinyl estradiol (LOESTRIN 1/20, 21,) 1-20 MG-MCG tablet Take 1 tablet by mouth daily. 10/03/19  Yes Copland, Ilona Sorrel, PA-C  phentermine (ADIPEX-P) 37.5 MG tablet Take 1 tablet (37.5 mg total) by mouth daily before breakfast. 07/30/19  Yes [name], MD    Physical Exam Blood pressure 104/66, height 5\' 9"  (1.753 m), weight 249 lb (112.9 kg). No LMP recorded. (Menstrual status: Oral contraceptives). Body mass index is 36.77 kg/m.  General: NAD HEENT: normocephalic,  anicteric Thyroid: no enlargement Pulmonary: no increased work of breathing Neurologic: Grossly intact Psychiatric: mood appropriate, affect full  Assessment: 34 y.o. 31 presenting for discussion of weight loss management options  Plan: Problem List Items Addressed This Visit    None    Visit Diagnoses    Class 2 obesity without serious comorbidity with body mass index (BMI) of 36.0 to 36.9 in adult, unspecified obesity type    -  Primary   Relevant Medications   phentermine (ADIPEX-P) 37.5 MG tablet   Other Relevant Orders   Thyroid Panel With TSH   Hemoglobin A1c   Lipid panel   Thyroid disorder screening       Relevant Orders   Thyroid Panel With TSH   Diabetes mellitus screening       Relevant Orders   Hemoglobin A1c   Lipid screening       Relevant Orders   Lipid panel      1) 1500 Calorie ADA Diet  2) Patient education given regarding appropriate lifestyle changes for weight loss including: regular physical activity, healthy coping strategies, caloric restriction and healthy eating patterns.  3) Patient will be started on weight loss medication. The risks and benefits and side effects of medication, such as Adipex (Phenteramine) ,  Tenuate (Diethylproprion), Belviq (lorcarsin), Contrave (buproprion/naltrexone), Qsymia (phentermine/topiramate), and Saxenda (liraglutide) is discussed. The pros and cons of suppressing appetite and boosting metabolism is discussed. Risks of tolerence and addiction is discussed for selected agents discussed. Use of medicine will ne short term, such as 3-4 months at a time followed by a period of time off of the medicine to avoid these risks and side effects for Adipex, Qsymia, and Tenuate discussed. Pt to call with any negative side effects and agrees to keep follow up appts.  4) Comorbidity Screening - hypothyroidism screening, diabetes, and hyperlipidemia screening offered  5) Encouraged weekly weight monitorig to track progress and  sample 1 week food diary  6) Contraception - discussed that all weight loss drugs fall in to pregnancy category X, patient currently has reliable contraception in the form of OCP  7) 15 minutes face-to-face; counseling/coordination of care > 50 percent of visit  8) Return in about 4 weeks (around 11/11/2019) for medication follow up.   01/11/2020, MD, [name] [facility], [facility] 10/14/2019, 8:29 AM

## 2019-10-15 LAB — LIPID PANEL
Chol/HDL Ratio: 2.4 ratio (ref 0.0–4.4)
Cholesterol, Total: 165 mg/dL (ref 100–199)
HDL: 70 mg/dL (ref >39)
LDL Chol Calc (NIH): 85 mg/dL (ref 0–99)
Triglycerides: 48 mg/dL (ref 0–149)
VLDL Cholesterol Cal: 10 mg/dL (ref 5–40)

## 2019-10-15 LAB — THYROID PANEL WITH TSH
Free Thyroxine Index: 1.4 (ref 1.2–4.9)
T3 Uptake Ratio: 23 % — ABNORMAL LOW (ref 24–39)
T4, Total: 6 ug/dL (ref 4.5–12.0)
TSH: 1.42 u[IU]/mL (ref 0.450–4.500)

## 2019-10-15 LAB — HEMOGLOBIN A1C
Est. average glucose Bld gHb Est-mCnc: 103 mg/dL
Hgb A1c MFr Bld: 5.2 % (ref 4.8–5.6)

## 2019-10-19 ENCOUNTER — Other Ambulatory Visit: Payer: Self-pay | Admitting: Obstetrics and Gynecology

## 2019-10-19 DIAGNOSIS — Z30011 Encounter for initial prescription of contraceptive pills: Secondary | ICD-10-CM

## 2019-10-21 MED ORDER — NORETHINDRONE ACET-ETHINYL EST 1-20 MG-MCG PO TABS: 1.0000 | ORAL_TABLET | Freq: Every day | ORAL | 0 refills | Status: DC

## 2019-10-21 NOTE — Telephone Encounter (Signed)
Rx sent in 10/2019. Please advise

## 2019-11-11 ENCOUNTER — Ambulatory Visit: Payer: 59 | Admitting: Obstetrics and Gynecology

## 2019-11-18 ENCOUNTER — Encounter: Payer: Self-pay | Admitting: Obstetrics and Gynecology

## 2019-11-18 ENCOUNTER — Ambulatory Visit (INDEPENDENT_AMBULATORY_CARE_PROVIDER_SITE_OTHER): Payer: 59 | Admitting: Obstetrics and Gynecology

## 2019-11-18 ENCOUNTER — Other Ambulatory Visit: Payer: Self-pay

## 2019-11-18 VITALS — Wt 243.0 lb

## 2019-11-18 DIAGNOSIS — Z30011 Encounter for initial prescription of contraceptive pills: Secondary | ICD-10-CM

## 2019-11-18 DIAGNOSIS — Z6835 Body mass index (BMI) 35.0-35.9, adult: Secondary | ICD-10-CM | POA: Diagnosis not present

## 2019-11-18 DIAGNOSIS — E669 Obesity, unspecified: Secondary | ICD-10-CM

## 2019-11-18 MED ORDER — NORETHINDRONE ACET-ETHINYL EST 1-20 MG-MCG PO TABS: 1.0000 | ORAL_TABLET | Freq: Every day | ORAL | 3 refills | Status: DC

## 2019-11-18 MED ORDER — PHENTERMINE HCL 37.5 MG PO TABS
37.5000 mg | ORAL_TABLET | Freq: Every day | ORAL | 0 refills | Status: DC
Start: 2019-11-18 — End: 2020-04-29

## 2019-11-18 NOTE — Progress Notes (Signed)
TELEPHONE VISIT  Pt is having a med follow- up on Weight Management. And she would like to talk to you about her Birth Control as well.

## 2019-11-18 NOTE — Progress Notes (Signed)
I connected with Brittany Mcpherson on 11/18/19 at  2:50 PM EDT by telephone and verified that I am speaking with the correct person using two identifiers.   I discussed the limitations, risks, security and privacy concerns of performing an evaluation and management service by telephone and the availability of in person appointments. I also discussed with the patient that there may be a patient responsible charge related to this service. The patient expressed understanding and agreed to proceed.  The patient was at home I spoke with the patient from my workstation phone The names of people involved in this encounter were: Brittany Mcpherson , and Brittany Mcpherson   Gynecology Office Visit  Chief Complaint:  Chief Complaint  Patient presents with  . Gynecologic Exam    History of Present Illness: Patientis a 34 y.o. [phone] female, who presents for the evaluation of the desire to lose weight. She has lost 5 pounds 1 months. The patient states the following symptoms since starting her weight loss therapy: appetite suppression, energy, and weight loss.  The patient also reports no other ill effects. The patient specifically denies heart palpitations, anxiety, and insomnia.    Review of Systems: 10 point review of systems negative unless otherwise noted in HPI  Past Medical History:  Past Medical History:  Diagnosis Date  . Anemia     Past Surgical History:  Past Surgical History:  Procedure Laterality Date  . DILATION AND CURETTAGE OF UTERUS      Gynecologic History: No LMP recorded. (Menstrual status: Oral contraceptives).  Obstetric History: [id number]  Family History:  Family History  Problem Relation Age of Onset  . Prostate cancer Father   . Breast cancer Paternal Grandmother 35    Social History:  Social History   Socioeconomic History  . Marital status: Legally Separated    Spouse name: Not on file  . Number of children: Not on file  . Years of education: Not on  file  . Highest education level: Not on file  Occupational History  . Not on file  Tobacco Use  . Smoking status: Never Smoker  . Smokeless tobacco: Never Used  Vaping Use  . Vaping Use: Never used  Substance and Sexual Activity  . Alcohol use: Yes    Comment: Occasional  . Drug use: No  . Sexual activity: Yes    Partners: Male    Birth control/protection: Pill  Other Topics Concern  . Not on file  Social History Narrative  . Not on file   Social Determinants of Health   Financial Resource Strain:   . Difficulty of Paying Living Expenses: Not on file  Food Insecurity:   . Worried About Programme researcher, broadcasting/film/video in the Last Year: Not on file  . Ran Out of Food in the Last Year: Not on file  Transportation Needs:   . Lack of Transportation (Medical): Not on file  . Lack of Transportation (Non-Medical): Not on file  Physical Activity:   . Days of Exercise per Week: Not on file  . Minutes of Exercise per Session: Not on file  Stress:   . Feeling of Stress : Not on file  Social Connections:   . Frequency of Communication with Friends and Family: Not on file  . Frequency of Social Gatherings with Friends and Family: Not on file  . Attends Religious Services: Not on file  . Active Member of Clubs or Organizations: Not on file  . Attends Banker Meetings:  Not on file  . Marital Status: Not on file  Intimate Partner Violence:   . Fear of Current or Ex-Partner: Not on file  . Emotionally Abused: Not on file  . Physically Abused: Not on file  . Sexually Abused: Not on file    Allergies:  No Known Allergies  Medications: Prior to Admission medications   Medication Sig Start Date End Date Taking? Authorizing Provider  Multiple Vitamins-Minerals (MULTIVITAMIN ADULT PO) multivitamin   Yes [provider]  norethindrone-ethinyl estradiol (LOESTRIN 1/20, 21,) 1-20 MG-MCG tablet Take 1 tablet by mouth daily. 10/21/19  Yes Copland, Ilona Sorrel, PA-C  phentermine  (ADIPEX-P) 37.5 MG tablet Take 1 tablet (37.5 mg total) by mouth daily before breakfast. 10/14/19  Yes Brittany Austria, MD    Physical Exam Weight 243 lb (110.2 kg). Wt Readings from Last 3 Encounters:  11/18/19 243 lb (110.2 kg)  10/14/19 249 lb (112.9 kg)  10/03/19 249 lb (112.9 kg)  Body mass index is 35.88 kg/m.  No physical exam as this was a remote telephone visit to promote social distancing during the current COVID-19 Pandemic  Assessment: 34 y.o. [id number] No problem-specific Assessment & Plan notes found for this encounter.   Plan: Problem List Items Addressed This Visit    None    Visit Diagnoses    Class 2 obesity without serious comorbidity with body mass index (BMI) of 35.0 to 35.9 in adult, unspecified obesity type    -  Primary   Relevant Medications   phentermine (ADIPEX-P) 37.5 MG tablet   Encounter for initial prescription of contraceptive pills       Relevant Medications   norethindrone-ethinyl estradiol (LOESTRIN 1/20, 21,) 1-20 MG-MCG tablet      1) 1500 Calorie ADA Diet  2) Patient education given regarding appropriate lifestyle changes for weight loss including: regular physical activity, healthy coping strategies, caloric restriction and healthy eating patterns.  3) Patient to take medication, with the benefits of appetite suppression and metabolism boost d/w pt, along with the side effects and risk factors of long term use that will be avoided with our use of short bursts of therapy. Rx provided.    4) Telephone Time 5:00 minutes  5)  No follow-ups on file.    Brittany Austria, MD, [facility], [facility] 11/18/2019, 3:21 PM

## 2019-12-16 ENCOUNTER — Ambulatory Visit: Payer: 59 | Admitting: Obstetrics and Gynecology

## 2020-04-09 NOTE — Telephone Encounter (Signed)
Annual next 1-2 weeks myself of [place]

## 2020-04-29 ENCOUNTER — Other Ambulatory Visit (HOSPITAL_COMMUNITY)
Admission: RE | Admit: 2020-04-29 | Discharge: 2020-04-29 | Disposition: A | Discharge status: Home / Self Care | Payer: Self-pay | Source: Ambulatory Visit | Attending: Obstetrics and Gynecology | Admitting: Obstetrics and Gynecology

## 2020-04-29 ENCOUNTER — Other Ambulatory Visit: Payer: Self-pay

## 2020-04-29 ENCOUNTER — Encounter: Payer: Self-pay | Admitting: Obstetrics and Gynecology

## 2020-04-29 ENCOUNTER — Ambulatory Visit (INDEPENDENT_AMBULATORY_CARE_PROVIDER_SITE_OTHER): Payer: 59 | Admitting: Obstetrics and Gynecology

## 2020-04-29 VITALS — BP 120/82 | HR 71 | Ht 69.0 in | Wt 271.0 lb

## 2020-04-29 DIAGNOSIS — Z1329 Encounter for screening for other suspected endocrine disorder: Secondary | ICD-10-CM

## 2020-04-29 DIAGNOSIS — R5383 Other fatigue: Secondary | ICD-10-CM

## 2020-04-29 DIAGNOSIS — Z30011 Encounter for initial prescription of contraceptive pills: Secondary | ICD-10-CM

## 2020-04-29 DIAGNOSIS — F419 Anxiety disorder, unspecified: Secondary | ICD-10-CM

## 2020-04-29 DIAGNOSIS — Z1239 Encounter for other screening for malignant neoplasm of breast: Secondary | ICD-10-CM | POA: Diagnosis not present

## 2020-04-29 DIAGNOSIS — Z124 Encounter for screening for malignant neoplasm of cervix: Secondary | ICD-10-CM

## 2020-04-29 DIAGNOSIS — Z113 Encounter for screening for infections with a predominantly sexual mode of transmission: Secondary | ICD-10-CM | POA: Insufficient documentation

## 2020-04-29 DIAGNOSIS — Z01419 Encounter for gynecological examination (general) (routine) without abnormal findings: Secondary | ICD-10-CM

## 2020-04-29 DIAGNOSIS — F32A Depression, unspecified: Secondary | ICD-10-CM

## 2020-04-29 DIAGNOSIS — Z13220 Encounter for screening for lipoid disorders: Secondary | ICD-10-CM

## 2020-04-29 DIAGNOSIS — Z131 Encounter for screening for diabetes mellitus: Secondary | ICD-10-CM

## 2020-04-29 DIAGNOSIS — Z6841 Body Mass Index (BMI) 40.0 and over, adult: Secondary | ICD-10-CM

## 2020-04-29 MED ORDER — BUPROPION HCL ER (XL) 150 MG PO TB24: 150.0000 mg | ORAL_TABLET | Freq: Every day | ORAL | 2 refills | Status: DC

## 2020-04-29 MED ORDER — NORETHINDRONE ACET-ETHINYL EST 1-20 MG-MCG PO TABS: 1.0000 | ORAL_TABLET | Freq: Every day | ORAL | 3 refills | Status: DC

## 2020-04-29 NOTE — Progress Notes (Signed)
Gynecology Annual Exam   PCP: [name], MD  Chief Complaint:  Chief Complaint  Patient presents with  . Gynecologic Exam    Weight gain/physical for school    History of Present Illness: Patient is a 35 y.o. [id number] presents for annual exam. The patient has no complaints today.   LMP: Patient's last menstrual period was 04/28/2020. Average Interval: regular, 28 days Duration of flow: 4 days Heavy Menses: no Clots: no Intermenstrual Bleeding: no Postcoital Bleeding: no Dysmenorrhea: no  The patient is sexually active. She currently uses OCP (estrogen/progesterone) for contraception. Did go through divorce last year.  She denies dyspareunia.  The patient does perform self breast exams.  There is no notable family history of breast or ovarian cancer in her family.  The patient wears seatbelts: yes.   The patient has regular exercise: not asked.    The patient reports current symptoms of depression.  Divorce last year, single parent with 3 children, going back to school this year.    Review of Systems: Review of Systems  Constitutional: Negative for chills and fever.  HENT: Negative for congestion.   Respiratory: Negative for cough and shortness of breath.   Cardiovascular: Negative for chest pain and palpitations.  Gastrointestinal: Negative for abdominal pain, constipation, diarrhea, heartburn, nausea and vomiting.  Genitourinary: Negative for dysuria, frequency and urgency.  Skin: Negative for itching and rash.  Neurological: Negative for dizziness and headaches.  Endo/Heme/Allergies: Negative for polydipsia.  Psychiatric/Behavioral: Negative for depression.    Past Medical History:  Patient Active Problem List   Diagnosis Date Noted  . Class 2 severe obesity with serious comorbidity and body mass index (BMI) of 39.0 to 39.9 in adult [facility]) 03/26/2019    Past Surgical History:  Past Surgical History:  Procedure Laterality Date  . DILATION AND CURETTAGE OF  UTERUS      Gynecologic History:  Patient's last menstrual period was 04/28/2020. Contraception: OCP (estrogen/progesterone)  Obstetric History: [id number]  Family History:  Family History  Problem Relation Age of Onset  . Prostate cancer Father   . Breast cancer Paternal Grandmother 42    Social History:  Social History   Socioeconomic History  . Marital status: Legally Separated    Spouse name: Not on file  . Number of children: Not on file  . Years of education: Not on file  . Highest education level: Not on file  Occupational History  . Not on file  Tobacco Use  . Smoking status: Never Smoker  . Smokeless tobacco: Never Used  Vaping Use  . Vaping Use: Never used  Substance and Sexual Activity  . Alcohol use: Yes    Comment: Occasional  . Drug use: No  . Sexual activity: Yes    Partners: Male    Birth control/protection: Pill  Other Topics Concern  . Not on file  Social History Narrative  . Not on file   Social Determinants of Health   Financial Resource Strain: Not on file  Food Insecurity: Not on file  Transportation Needs: Not on file  Physical Activity: Not on file  Stress: Not on file  Social Connections: Not on file  Intimate Partner Violence: Not on file    Allergies:  No Known Allergies  Medications: Prior to Admission medications   Medication Sig Start Date End Date Taking? Authorizing Provider  Multiple Vitamins-Minerals (MULTIVITAMIN ADULT PO) multivitamin   Yes [provider]  norethindrone-ethinyl estradiol (LOESTRIN 1/20, 21,) 1-20 MG-MCG tablet Take 1 tablet  by mouth daily. 11/18/19  Yes [name], MD  phentermine (ADIPEX-P) 37.5 MG tablet Take 1 tablet (37.5 mg total) by mouth daily before breakfast. Patient not taking: Reported on 04/29/2020 11/18/19   [name], MD    Physical Exam Vitals: Blood pressure 120/82, pulse 71, height 5\' 9"  (1.753 m), weight 271 lb (122.9 kg), last menstrual period  04/28/2020. Body mass index is 40.02 kg/m.  General: NAD HEENT: normocephalic, anicteric Thyroid: no enlargement, no palpable nodules Pulmonary: No increased work of breathing, CTAB Cardiovascular: RRR, distal pulses 2+ Breast: Breast symmetrical, no tenderness, no palpable nodules or masses, no skin or nipple retraction present, no nipple discharge.  No axillary or supraclavicular lymphadenopathy. Abdomen: NABS, soft, non-tender, non-distended.  Umbilicus without lesions.  No hepatomegaly, splenomegaly or masses palpable. No evidence of hernia  Genitourinary:  External: Normal external female genitalia.  Normal urethral meatus, normal Bartholin's and Skene's glands.    Vagina: Normal vaginal mucosa, no evidence of prolapse.    Cervix: Grossly normal in appearance, light bleeding  Uterus: Non-enlarged, mobile, normal contour.  No CMT  Adnexa: ovaries non-enlarged, no adnexal masses  Rectal: deferred  Lymphatic: no evidence of inguinal lymphadenopathy Extremities: no edema, erythema, or tenderness Neurologic: Grossly intact Psychiatric: mood appropriate, affect full  Female chaperone present for pelvic and breast  portions of the physical exam  Immunization History  Administered Date(s) Administered  . Moderna SARS-COV2 Booster Vaccination 12/11/2019  . Moderna Sars-Covid-2 Vaccination 02/26/2019, 03/25/2019  . PFIZER(Purple Top)SARS-COV-2 Vaccination 12/11/2019  . Tdap 03/05/2014   Flowsheet Row [facility] Visit from [facility]/29/2020 in [facility]  PHQ-9 Total Score 6     GAD 7 : Generalized Anxiety Score 04/29/2020  Nervous, Anxious, on Edge 2  Control/stop worrying 1  Worry too much - different things 0  Trouble relaxing 1  Restless 1  Easily annoyed or irritable 2  Afraid - awful might happen 1  Total GAD 7 Score 8  Anxiety Difficulty Somewhat difficult       Assessment: 35 y.o. female routine annual exam  Plan: Problem List Items Addressed This Visit    None   Visit Diagnoses    Encounter for gynecological examination without abnormal finding    -  Primary   Breast screening       Screening for malignant neoplasm of cervix       Relevant Orders   Cytology - PAP   Class 3 severe obesity without serious comorbidity with body mass index (BMI) of 40.0 to 44.9 in adult, unspecified obesity type (HCC)       Relevant Orders   Lipid panel   TSH   Hemoglobin A1c   CBC   Basic Metabolic Panel (BMET)   Fatigue, unspecified type       Relevant Orders   TSH   Hemoglobin A1c   CBC   Basic Metabolic Panel (BMET)   Anxiety and depression       Relevant Medications   buPROPion (WELLBUTRIN XL) 150 MG 24 hr tablet   Other Relevant Orders   TSH   Encounter for initial prescription of contraceptive pills       Relevant Medications   norethindrone-ethinyl estradiol (LOESTRIN 1/20, 21,) 1-20 MG-MCG tablet   Routine screening for STI (sexually transmitted infection)       Relevant Orders   HEP, RPR, HIV Panel   Cytology - PAP   Thyroid disorder screening       Relevant Orders   TSH   Screening for  diabetes mellitus       Relevant Orders   Hemoglobin A1c   Basic Metabolic Panel (BMET)   Lipid screening       Relevant Orders   Lipid panel      1) STI screening  was offered and accepted  2)  ASCCP guidelines and rational discussed.  Patient opts for every 3 years screening interval  3) Contraception - the patient is currently using  OCP (estrogen/progesterone).  She is happy with her current form of contraception and plans to continue  4) Routine healthcare maintenance including cholesterol, diabetes screening discussed Ordered today  5) Anxiety - also starting school and working with 3 kids - start Wellbutrin XR 150  6) Return in about 2 weeks (around 05/13/2020) for Medication follow up.   [name], MD, [facility], [facility] 04/29/2020, 9:24 AM

## 2020-04-30 LAB — CBC
Hematocrit: 34.6 % (ref 34.0–46.6)
Hemoglobin: 10.9 g/dL — ABNORMAL LOW (ref 11.1–15.9)
MCH: 28.1 pg (ref 26.6–33.0)
MCHC: 31.5 g/dL (ref 31.5–35.7)
MCV: 89 fL (ref 79–97)
Platelets: 267 10*3/uL (ref 150–450)
RBC: 3.88 x10E6/uL (ref 3.77–5.28)
RDW: 13.4 % (ref 11.7–15.4)
WBC: 4 10*3/uL (ref 3.4–10.8)

## 2020-04-30 LAB — BASIC METABOLIC PANEL
BUN/Creatinine Ratio: 8 — ABNORMAL LOW (ref 9–23)
BUN: 8 mg/dL (ref 6–20)
CO2: 18 mmol/L — ABNORMAL LOW (ref 20–29)
Calcium: 8.9 mg/dL (ref 8.7–10.2)
Chloride: 104 mmol/L (ref 96–106)
Creatinine, Ser: 0.95 mg/dL (ref 0.57–1.00)
Glucose: 73 mg/dL (ref 65–99)
Potassium: 4.3 mmol/L (ref 3.5–5.2)
Sodium: 140 mmol/L (ref 134–144)
eGFR: 79 mL/min/{1.73_m2} (ref >59)

## 2020-04-30 LAB — TSH: TSH: 1.34 u[IU]/mL (ref 0.450–4.500)

## 2020-04-30 LAB — HEP, RPR, HIV PANEL
HIV Screen 4th Generation wRfx: NONREACTIVE
Hepatitis B Surface Ag: NEGATIVE
RPR Ser Ql: NONREACTIVE

## 2020-04-30 LAB — LIPID PANEL
Chol/HDL Ratio: 2.3 ratio (ref 0.0–4.4)
Cholesterol, Total: 201 mg/dL — ABNORMAL HIGH (ref 100–199)
HDL: 87 mg/dL (ref >39)
LDL Chol Calc (NIH): 102 mg/dL — ABNORMAL HIGH (ref 0–99)
Triglycerides: 69 mg/dL (ref 0–149)
VLDL Cholesterol Cal: 12 mg/dL (ref 5–40)

## 2020-04-30 LAB — HEMOGLOBIN A1C
Est. average glucose Bld gHb Est-mCnc: 108 mg/dL
Hgb A1c MFr Bld: 5.4 % (ref 4.8–5.6)

## 2020-05-01 LAB — CYTOLOGY - PAP
Chlamydia: NEGATIVE
Comment: NEGATIVE
Comment: NEGATIVE
Comment: NORMAL
Diagnosis: NEGATIVE
Diagnosis: REACTIVE
High risk HPV: NEGATIVE
Neisseria Gonorrhea: NEGATIVE

## 2020-05-12 ENCOUNTER — Other Ambulatory Visit: Payer: Self-pay

## 2020-05-12 ENCOUNTER — Ambulatory Visit (INDEPENDENT_AMBULATORY_CARE_PROVIDER_SITE_OTHER): Payer: 59 | Admitting: Obstetrics and Gynecology

## 2020-05-12 ENCOUNTER — Encounter: Payer: Self-pay | Admitting: Obstetrics and Gynecology

## 2020-05-12 DIAGNOSIS — F32A Depression, unspecified: Secondary | ICD-10-CM

## 2020-05-12 DIAGNOSIS — E8881 Metabolic syndrome: Secondary | ICD-10-CM

## 2020-05-12 DIAGNOSIS — Z6839 Body mass index (BMI) 39.0-39.9, adult: Secondary | ICD-10-CM

## 2020-05-12 DIAGNOSIS — F419 Anxiety disorder, unspecified: Secondary | ICD-10-CM | POA: Diagnosis not present

## 2020-05-12 MED ORDER — INSULIN PEN NEEDLE 32G X 6 MM MISC: 1.0000 [IU] | 0 refills | Status: DC

## 2020-05-12 MED ORDER — OZEMPIC (0.25 OR 0.5 MG/DOSE) 2 MG/1.5ML ~~LOC~~ SOPN: PEN_INJECTOR | SUBCUTANEOUS | 0 refills | Status: DC

## 2020-05-12 NOTE — Progress Notes (Signed)
I connected with Brittany Mcpherson on 05/13/20 at  3:10 PM EDT by telephone and verified that I am speaking with the correct person using two identifiers.   I discussed the limitations, risks, security and privacy concerns of performing an evaluation and management service by telephone and the availability of in person appointments. I also discussed with the patient that there may be a patient responsible charge related to this service. The patient expressed understanding and agreed to proceed.  The patient was at home I spoke with the patient from my workstation phone The names of people involved in this encounter were: Brittany Mcpherson , and [name]   Obstetrics & Gynecology Office Visit   Chief Complaint:  Chief Complaint  Patient presents with  . Follow-up    Phone visit - no concerns, doing great with Wellbutrin.      History of Present Illness: The patient is a 35 y.o. female presenting follow up for symptoms of anxiety and depression.  The patient is currently taking Wellbutrin XL 150mg   for the management of her symptoms.  She has not had any recent situational stressors.  She reports satisfactory improvement symptoms..  She denies anhedonia, day time somnolence, insomnia, risk taking behavior, irritability, social anxiety, agorophobia, feelings of guilt, feelings of worthlessness, suicidal ideation, homicidal ideation, auditory hallucinations and visual hallucinations. Symptoms have improved since last visit.     The patient does not have a pre-existing history of depression and anxiety.  She  does not a prior history of suicide attempts.    Review of Systems: Review of systems negative unless noted in HPI  Past Medical History:  Past Medical History:  Diagnosis Date  . Anemia     Past Surgical History:  Past Surgical History:  Procedure Laterality Date  . DILATION AND CURETTAGE OF UTERUS      Gynecologic History: Patient's last menstrual period was  04/28/2020.  Obstetric History: 04/30/2020  Family History:  Family History  Problem Relation Age of Onset  . Prostate cancer Father   . Breast cancer Paternal Grandmother 38    Social History:  Social History   Socioeconomic History  . Marital status: Divorced    Spouse name: Not on file  . Number of children: Not on file  . Years of education: Not on file  . Highest education level: Not on file  Occupational History  . Not on file  Tobacco Use  . Smoking status: Never Smoker  . Smokeless tobacco: Never Used  Vaping Use  . Vaping Use: Never used  Substance and Sexual Activity  . Alcohol use: Yes    Comment: Occasional  . Drug use: No  . Sexual activity: Yes    Partners: Male    Birth control/protection: Pill  Other Topics Concern  . Not on file  Social History Narrative  . Not on file   Social Determinants of Health   Financial Resource Strain: Not on file  Food Insecurity: Not on file  Transportation Needs: Not on file  Physical Activity: Not on file  Stress: Not on file  Social Connections: Not on file  Intimate Partner Violence: Not on file    Allergies:  No Known Allergies  Medications: Prior to Admission medications   Medication Sig Start Date End Date Taking? Authorizing Provider  buPROPion (WELLBUTRIN XL) 150 MG 24 hr tablet Take 1 tablet (150 mg total) by mouth daily. 04/29/20  Yes 05/01/20, MD  Multiple Vitamins-Minerals (MULTIVITAMIN ADULT PO) multivitamin  Yes [provider]  norethindrone-ethinyl estradiol (LOESTRIN 1/20, 21,) 1-20 MG-MCG tablet Take 1 tablet by mouth daily. 04/29/20  Yes [name], MD    Physical Exam Vitals: Height 5\' 9"  (1.753 m), weight 266 lb (120.7 kg), last menstrual period 04/28/2020.  Patient's last menstrual period was 04/28/2020. Body mass index is 39.28 kg/m.  No physical exam as this was a remote telephone visit to promote social distancing during the current COVID-19 Pandemic  GAD 7  : Generalized Anxiety Score 05/12/2020 04/29/2020  Nervous, Anxious, on Edge 1 2  Control/stop worrying 1 1  Worry too much - different things 1 0  Trouble relaxing 0 1  Restless 0 1  Easily annoyed or irritable 1 2  Afraid - awful might happen 1 1  Total GAD 7 Score 5 8  Anxiety Difficulty Somewhat difficult Somewhat difficult    Depression screen [facility] 2/9 05/12/2020 04/29/2020 04/29/2020  Decreased Interest 0 0 0  Down, Depressed, Hopeless 0 0 0  PHQ - 2 Score 0 0 0  Altered sleeping 0 0 0  Tired, decreased energy 1 2 2   Change in appetite 2 2 2   Feeling bad or failure about yourself  1 2 2   Trouble concentrating 1 0 0  Moving slowly or fidgety/restless 0 0 0  Suicidal thoughts 0 0 0  PHQ-9 Score 5 6 6   Difficult doing work/chores Not difficult at all Not difficult at all Not difficult at all    Depression screen [facility] 2/9 05/12/2020 04/29/2020 04/29/2020  Decreased Interest 0 0 0  Down, Depressed, Hopeless 0 0 0  PHQ - 2 Score 0 0 0  Altered sleeping 0 0 0  Tired, decreased energy 1 2 2   Change in appetite 2 2 2   Feeling bad or failure about yourself  1 2 2   Trouble concentrating 1 0 0  Moving slowly or fidgety/restless 0 0 0  Suicidal thoughts 0 0 0  PHQ-9 Score 5 6 6   Difficult doing work/chores Not difficult at all Not difficult at all Not difficult at all     Assessment: 35 y.o. 4/9 medical weight loss follow up interested in trial of Ozempic  Plan: Problem List Items Addressed This Visit      Other   Class 2 severe obesity with serious comorbidity and body mass index (BMI) of 39.0 to 39.9 in adult [facility]) - Primary   Relevant Medications   Semaglutide,0.25 or 0.5MG /DOS, (OZEMPIC, 0.25 OR 0.5 MG/DOSE,) 2 MG/1.5ML SOPN    Other Visit Diagnoses    Anxiety and depression          1) Continue Wellbutrin XL 150mg   2) Thyroid and B12 screen has been obtained previously  3) Add ozempic for weight loss.  HgbA1C normal 04/29/2020 however PCOS is in the differential  for her recent weight gain.  TSH also checked and normal.  4) Telephone time 10:5min  5) Return in about 4 weeks (around 06/09/2020) for medication follow up.    , MD, [facility], [facility] 05/12/2020, 3:30 PM

## 2020-06-02 ENCOUNTER — Other Ambulatory Visit: Payer: Self-pay | Admitting: Obstetrics and Gynecology

## 2020-06-02 MED ORDER — PHENTERMINE HCL 37.5 MG PO TABS: 37.5000 mg | ORAL_TABLET | Freq: Every day | ORAL | 0 refills | Status: DC

## 2020-06-02 NOTE — Progress Notes (Signed)
Ozempic was not approved would like to restart phentermine for weight loss.

## 2020-06-12 ENCOUNTER — Ambulatory Visit: Payer: 59 | Admitting: Obstetrics and Gynecology

## 2020-06-13 ENCOUNTER — Encounter: Payer: Self-pay | Admitting: Nurse Practitioner

## 2020-06-13 DIAGNOSIS — E7800 Pure hypercholesterolemia, unspecified: Secondary | ICD-10-CM | POA: Insufficient documentation

## 2020-06-13 DIAGNOSIS — F419 Anxiety disorder, unspecified: Secondary | ICD-10-CM | POA: Insufficient documentation

## 2020-06-13 DIAGNOSIS — F32A Depression, unspecified: Secondary | ICD-10-CM | POA: Insufficient documentation

## 2020-06-17 ENCOUNTER — Ambulatory Visit: Payer: Self-pay | Admitting: Nurse Practitioner

## 2021-01-21 ENCOUNTER — Other Ambulatory Visit (HOSPITAL_COMMUNITY)
Admission: RE | Admit: 2021-01-21 | Discharge: 2021-01-21 | Disposition: A | Discharge status: Home / Self Care | Payer: Self-pay | Source: Ambulatory Visit | Attending: Obstetrics and Gynecology | Admitting: Obstetrics and Gynecology

## 2021-01-21 ENCOUNTER — Encounter: Payer: Self-pay | Admitting: Obstetrics and Gynecology

## 2021-01-21 ENCOUNTER — Ambulatory Visit (INDEPENDENT_AMBULATORY_CARE_PROVIDER_SITE_OTHER): Payer: BC Managed Care – PPO | Admitting: Obstetrics and Gynecology

## 2021-01-21 ENCOUNTER — Other Ambulatory Visit: Payer: Self-pay

## 2021-01-21 VITALS — BP 134/80 | Ht 69.0 in | Wt 307.0 lb

## 2021-01-21 DIAGNOSIS — Z113 Encounter for screening for infections with a predominantly sexual mode of transmission: Secondary | ICD-10-CM | POA: Insufficient documentation

## 2021-01-21 NOTE — Progress Notes (Signed)
[place] Pink, MD   Chief Complaint  Patient presents with   STD testing    HPI:      [name] is a 35 y.o. [phone] whose LMP was Patient's last menstrual period was 01/04/2021 (approximate)., presents today for STD testing. Neg pap/neg HPV, neg gon/chlam/HIV/RPR/HEP C 3/22. Has a new partner, no sx. Just wants to be safe.  Declines BC.  Patient Active Problem List   Diagnosis Date Noted   Anxiety and depression 06/13/2020   Elevated low density lipoprotein (LDL) cholesterol level 06/13/2020   Class 2 severe obesity with serious comorbidity and body mass index (BMI) of 39.0 to 39.9 in adult [facility]) 03/26/2019    Past Surgical History:  Procedure Laterality Date   DILATION AND CURETTAGE OF UTERUS      Family History  Problem Relation Age of Onset   Prostate cancer Father    Breast cancer Paternal Grandmother 55    Social History   Socioeconomic History   Marital status: Divorced    Spouse name: Not on file   Number of children: Not on file   Years of education: Not on file   Highest education level: Not on file  Occupational History   Not on file  Tobacco Use   Smoking status: Never   Smokeless tobacco: Never  Vaping Use   Vaping Use: Never used  Substance and Sexual Activity   Alcohol use: Yes    Comment: Occasional   Drug use: No   Sexual activity: Yes    Partners: Male    Birth control/protection: None  Other Topics Concern   Not on file  Social History Narrative   Not on file   Social Determinants of Health   Financial Resource Strain: Not on file  Food Insecurity: Not on file  Transportation Needs: Not on file  Physical Activity: Not on file  Stress: Not on file  Social Connections: Not on file  Intimate Partner Violence: Not on file    Outpatient Medications Prior to Visit  Medication Sig Dispense Refill   ferrous sulfate 325 (65 FE) MG tablet iron  325 mg     Multiple Vitamins-Minerals (MULTIVITAMIN ADULT PO) multivitamin      buPROPion (WELLBUTRIN XL) 150 MG 24 hr tablet Take 1 tablet (150 mg total) by mouth daily. 30 tablet 2   norethindrone-ethinyl estradiol (LOESTRIN 1/20, 21,) 1-20 MG-MCG tablet Take 1 tablet by mouth daily. 84 tablet 3   phentermine (ADIPEX-P) 37.5 MG tablet Take 1 tablet (37.5 mg total) by mouth daily before breakfast. 30 tablet 0   No facility-administered medications prior to visit.      ROS:  Review of Systems  Constitutional:  Negative for fever.  Gastrointestinal:  Negative for blood in stool, constipation, diarrhea, nausea and vomiting.  Genitourinary:  Negative for dyspareunia, dysuria, flank pain, frequency, hematuria, urgency, vaginal bleeding, vaginal discharge and vaginal pain.  Musculoskeletal:  Negative for back pain.  Skin:  Negative for rash.  BREAST: No symptoms   OBJECTIVE:   Vitals:  BP 134/80    Ht 5\' 9"  (1.753 m)    Wt (!) 307 lb (139.3 kg)    LMP 01/04/2021 (Approximate)    BMI 45.34 kg/m   Physical Exam Vitals reviewed.  Constitutional:      Appearance: She is well-developed.  Pulmonary:     Effort: Pulmonary effort is normal.  Genitourinary:    General: Normal vulva.     Pubic Area: No rash.  Labia:        Right: No rash, tenderness or lesion.        Left: No rash, tenderness or lesion.      Vagina: Normal. No vaginal discharge, erythema or tenderness.     Cervix: Normal.     Uterus: Normal. Not enlarged and not tender.      Adnexa: Right adnexa normal and left adnexa normal.       Right: No mass or tenderness.         Left: No mass or tenderness.    Musculoskeletal:        General: Normal range of motion.     Cervical back: Normal range of motion.  Skin:    General: Skin is warm and dry.  Neurological:     General: No focal deficit present.     Mental Status: She is alert and oriented to person, place, and time.  Psychiatric:        Mood and Affect: Mood normal.        Behavior: Behavior normal.        Thought Content: Thought  content normal.        Judgment: Judgment normal.    Assessment/Plan: Screening for STD (sexually transmitted disease) - Plan: HIV Antibody (routine testing w rflx), RPR, Hepatitis C antibody, Cervicovaginal ancillary only    No follow-ups on file.  Shahmeer Bunn B. Shaneya Taketa, PA-C 01/21/2021 9:07 AM

## 2021-01-22 LAB — RPR: RPR Ser Ql: NONREACTIVE

## 2021-01-22 LAB — HIV ANTIBODY (ROUTINE TESTING W REFLEX): HIV Screen 4th Generation wRfx: NONREACTIVE

## 2021-01-22 LAB — HEPATITIS C ANTIBODY: Hep C Virus Ab: 0.1 s/co ratio (ref 0.0–0.9)

## 2021-01-26 ENCOUNTER — Encounter: Payer: Self-pay | Admitting: Obstetrics and Gynecology

## 2021-01-26 LAB — CERVICOVAGINAL ANCILLARY ONLY
Chlamydia: NEGATIVE
Comment: NEGATIVE
Comment: NEGATIVE
Comment: NORMAL
Neisseria Gonorrhea: NEGATIVE
Trichomonas: NEGATIVE
